# Patient Record
Sex: Female | Born: 1965
Health system: Southern US, Community
[De-identification: ages and names within clinical notes are randomized; demographics above are authoritative.]

## PROBLEM LIST (undated history)

## (undated) DIAGNOSIS — F32A Depression, unspecified: Secondary | ICD-10-CM

## (undated) DIAGNOSIS — R32 Unspecified urinary incontinence: Secondary | ICD-10-CM

## (undated) DIAGNOSIS — R300 Dysuria: Secondary | ICD-10-CM

## (undated) DIAGNOSIS — R569 Unspecified convulsions: Secondary | ICD-10-CM

## (undated) DIAGNOSIS — F419 Anxiety disorder, unspecified: Secondary | ICD-10-CM

## (undated) DIAGNOSIS — F329 Major depressive disorder, single episode, unspecified: Secondary | ICD-10-CM

## (undated) DIAGNOSIS — I639 Cerebral infarction, unspecified: Secondary | ICD-10-CM

## (undated) DIAGNOSIS — N189 Chronic kidney disease, unspecified: Secondary | ICD-10-CM

## (undated) DIAGNOSIS — E785 Hyperlipidemia, unspecified: Secondary | ICD-10-CM

## (undated) DIAGNOSIS — J439 Emphysema, unspecified: Secondary | ICD-10-CM

## (undated) DIAGNOSIS — Z8673 Personal history of transient ischemic attack (TIA), and cerebral infarction without residual deficits: Secondary | ICD-10-CM

## (undated) DIAGNOSIS — N390 Urinary tract infection, site not specified: Secondary | ICD-10-CM

## (undated) DIAGNOSIS — F17200 Nicotine dependence, unspecified, uncomplicated: Secondary | ICD-10-CM

## (undated) HISTORY — DX: Hyperlipidemia, unspecified: E78.5

## (undated) HISTORY — DX: Unspecified urinary incontinence: R32

## (undated) HISTORY — PX: TUBAL LIGATION: SHX77

## (undated) HISTORY — DX: Urinary tract infection, site not specified: N39.0

## (undated) HISTORY — DX: Personal history of transient ischemic attack (TIA), and cerebral infarction without residual deficits: Z86.73

## (undated) HISTORY — DX: Nicotine dependence, unspecified, uncomplicated: F17.200

## (undated) HISTORY — DX: Depression, unspecified: F32.A

## (undated) HISTORY — DX: Unspecified convulsions: R56.9

## (undated) HISTORY — DX: Chronic kidney disease, unspecified: N18.9

## (undated) HISTORY — DX: Dysuria: R30.0

## (undated) HISTORY — DX: Emphysema, unspecified: J43.9

## (undated) HISTORY — DX: Major depressive disorder, single episode, unspecified: F32.9

## (undated) HISTORY — DX: Anxiety disorder, unspecified: F41.9

---

## 2004-11-06 ENCOUNTER — Emergency Department: Payer: Self-pay | Admitting: Emergency Medicine

## 2004-11-06 ENCOUNTER — Other Ambulatory Visit: Payer: Self-pay

## 2006-11-16 DIAGNOSIS — I639 Cerebral infarction, unspecified: Secondary | ICD-10-CM

## 2006-11-16 HISTORY — DX: Cerebral infarction, unspecified: I63.9

## 2006-11-18 ENCOUNTER — Emergency Department: Payer: Self-pay

## 2006-11-18 ENCOUNTER — Other Ambulatory Visit: Payer: Self-pay

## 2007-11-29 ENCOUNTER — Emergency Department: Payer: Self-pay | Admitting: Emergency Medicine

## 2008-06-02 HISTORY — PX: GALLBLADDER SURGERY: SHX652

## 2008-08-22 ENCOUNTER — Emergency Department: Payer: Self-pay | Admitting: Emergency Medicine

## 2009-12-24 ENCOUNTER — Emergency Department: Payer: Self-pay | Admitting: Emergency Medicine

## 2011-08-28 ENCOUNTER — Encounter: Payer: Self-pay | Admitting: Obstetrics and Gynecology

## 2012-05-27 ENCOUNTER — Other Ambulatory Visit: Payer: Self-pay

## 2012-05-27 DIAGNOSIS — Z1231 Encounter for screening mammogram for malignant neoplasm of breast: Secondary | ICD-10-CM

## 2012-06-28 ENCOUNTER — Ambulatory Visit
Admission: RE | Admit: 2012-06-28 | Discharge: 2012-06-28 | Disposition: A | Discharge status: Home / Self Care | Payer: Medicaid Other | Source: Ambulatory Visit

## 2012-06-28 DIAGNOSIS — Z1231 Encounter for screening mammogram for malignant neoplasm of breast: Secondary | ICD-10-CM

## 2015-02-27 DIAGNOSIS — F329 Major depressive disorder, single episode, unspecified: Secondary | ICD-10-CM | POA: Insufficient documentation

## 2015-02-27 DIAGNOSIS — I639 Cerebral infarction, unspecified: Secondary | ICD-10-CM | POA: Insufficient documentation

## 2015-02-27 DIAGNOSIS — Z8673 Personal history of transient ischemic attack (TIA), and cerebral infarction without residual deficits: Secondary | ICD-10-CM | POA: Insufficient documentation

## 2015-02-27 DIAGNOSIS — F32A Depression, unspecified: Secondary | ICD-10-CM | POA: Insufficient documentation

## 2015-03-29 ENCOUNTER — Other Ambulatory Visit: Payer: Self-pay

## 2015-04-03 ENCOUNTER — Telehealth: Payer: Self-pay | Admitting: Family Medicine

## 2015-04-03 NOTE — Telephone Encounter (Signed)
Patient is requesting a refill on xanex. She is completely out. Last seen on 10-06-14

## 2015-04-03 NOTE — Telephone Encounter (Signed)
Needs appointment

## 2015-04-04 ENCOUNTER — Other Ambulatory Visit: Payer: Self-pay

## 2015-04-04 NOTE — Telephone Encounter (Signed)
Patient has appointment scheduled for 04-05-15

## 2015-04-05 ENCOUNTER — Ambulatory Visit (INDEPENDENT_AMBULATORY_CARE_PROVIDER_SITE_OTHER): Payer: Medicare HMO | Admitting: Family Medicine

## 2015-04-05 ENCOUNTER — Encounter: Payer: Self-pay | Admitting: Family Medicine

## 2015-04-05 VITALS — BP 102/64 | HR 110 | Temp 99.0°F | Resp 12 | Ht 66.0 in | Wt 144.3 lb

## 2015-04-05 DIAGNOSIS — G40909 Epilepsy, unspecified, not intractable, without status epilepticus: Secondary | ICD-10-CM

## 2015-04-05 DIAGNOSIS — Z8673 Personal history of transient ischemic attack (TIA), and cerebral infarction without residual deficits: Secondary | ICD-10-CM

## 2015-04-05 DIAGNOSIS — F418 Other specified anxiety disorders: Secondary | ICD-10-CM

## 2015-04-05 DIAGNOSIS — F3341 Major depressive disorder, recurrent, in partial remission: Secondary | ICD-10-CM | POA: Diagnosis not present

## 2015-04-05 DIAGNOSIS — F17200 Nicotine dependence, unspecified, uncomplicated: Secondary | ICD-10-CM | POA: Diagnosis not present

## 2015-04-05 DIAGNOSIS — E785 Hyperlipidemia, unspecified: Secondary | ICD-10-CM | POA: Diagnosis not present

## 2015-04-05 MED ORDER — ALPRAZOLAM 0.5 MG PO TABS: 0.5000 mg | ORAL_TABLET | Freq: Three times a day (TID) | ORAL | Status: DC | PRN

## 2015-04-05 NOTE — Progress Notes (Signed)
Name: Kim Gallagher   MRN: [id number]    DOB: 09/13/1965   Date:04/05/2015       Progress Note  Subjective  Chief Complaint  Chief Complaint  Patient presents with  . Medication Refill    xanax    HPI  Kim Gallagher is a 49 year old female here for follow up, last seen 10/06/14.  Requesting Xanax refill. I have not received notes or blood work from her Neurologist to date. She reports being seizure free since mid 2015. Still taking Dilantin 300 mg at bedtime, Simvastatin 40 mg at bedtime, Zoloft 150 mg a day, ASA 325 mg a day. Still smoking cigarettes up to 1 ppd since age 89 yo. Not ready to quit.   Anxiety: Patient complains of anxiety disorder, panic attacks, post traumatic stress  disorder, sleep disturbance and depression.  She has the following symptoms: difficulty concentrating, fatigue, insomnia, irritable, racing thoughts. Onset of symptoms was approximately several years ago, controlled since that time. She denies current suicidal and homicidal ideation. Family history significant for anxiety and depression. Possible organic causes contributing are: neuro. Risk factors: negative life event TIA with subsequent Seizures and previous episode of depression Previous treatment includes Xanax and Zoloft and none.  She complains of the following side effects from the treatment: none.    Past Medical History  Diagnosis Date  . Hyperlipidemia, acquired   . Heavy smoker   . Chronic major depressive disorder (HCC)   . Anxiety   . Seizures (HCC)   . History of TIA (transient ischemic attack)   . Dysuria   . Urinary incontinence in female     Patient Active Problem List   Diagnosis Date Noted  . Hyperlipidemia LDL goal <100 04/05/2015  . Heavy tobacco smoker 04/05/2015  . Major depressive disorder, recurrent episode, in partial remission with anxious distress (HCC) 04/05/2015  . Seizure disorder (HCC) 04/05/2015  . History of TIA (transient ischemic attack) 04/05/2015    Social History   Substance Use Topics  . Smoking status: Current Every Day Smoker    Types: Cigarettes  . Smokeless tobacco: Not on file  . Alcohol Use: No     Current outpatient prescriptions:  .  ALPRAZolam (XANAX) 0.5 MG tablet, Take 0.5 mg by mouth every 8 (eight) hours as needed., Disp: , Rfl:  .  aspirin 325 MG tablet, Take 325 mg by mouth daily., Disp: , Rfl:  .  phenytoin (DILANTIN) 300 MG ER capsule, Take 300 mg by mouth 3 times daily with meals, bedtime and 2 AM., Disp: , Rfl:  .  sertraline (ZOLOFT) 100 MG tablet, Take 150 mg by mouth daily., Disp: , Rfl:  .  simvastatin (ZOCOR) 40 MG tablet, Take 40 mg by mouth daily., Disp: , Rfl:   Past Surgical History  Procedure Laterality Date  . Tubal ligation      Family History  Problem Relation Age of Onset  . Kidney disease Mother   . Hypertension Mother   . Depression Mother   . Diabetes Mother   . Stroke Father   . Hypertension Father   . Cancer Father     thyroid  . Diabetes Father   . Diabetes Sister   . Diabetes Brother     Allergies  Allergen Reactions  . Sulfa Antibiotics Rash     Review of Systems  CONSTITUTIONAL: No significant weight changes, fever, chills, weakness or fatigue.  CARDIOVASCULAR: No chest pain, chest pressure or chest discomfort. No palpitations or edema.  RESPIRATORY: No shortness of breath, cough or sputum.  NEUROLOGICAL: No headache, dizziness, syncope, paralysis, ataxia, numbness or tingling in the extremities. No memory changes. No change in bowel or bladder control.  MUSCULOSKELETAL: No joint pain. No muscle pain. HEMATOLOGIC: No anemia, bleeding or bruising.  LYMPHATICS: No enlarged lymph nodes.  PSYCHIATRIC: No change in mood. No change in sleep pattern.  ENDOCRINOLOGIC: No reports of sweating, cold or heat intolerance. No polyuria or polydipsia.     Objective  BP 102/64 mmHg  Pulse 110  Temp(Src) 99 F (37.2 C) (Oral)  Resp 12  Ht  (1.676 m)  Wt 144 lb 4.8 oz (65.454 kg)   BMI 23.30 kg/m2  SpO2 95% Body mass index is 23.3 kg/(m^2).  Physical Exam  Constitutional: Patient appears well-developed and well-nourished. In no distress. Skin is bronzed and tan with leathery texture. She smells of cigarette smoke.   Cardiovascular: Normal rate, regular rhythm and normal heart sounds.  No murmur heard.  Pulmonary/Chest: Effort normal and breath sounds normal. No respiratory distress. Peripheral vascular: Bilateral LE no edema. Neurological: CN II-XII grossly intact with no focal deficits. Alert and oriented to person, place, and time. Coordination, balance, strength, speech and gait are normal.  Skin: Skin is warm and dry. No rash noted. No erythema.  Psychiatric: Patient has a stable agitate mood and affect. Behavior is normal in office today, stable restlessness while sitting in chair. Judgment and thought content normal in office today.  Assessment & Plan  1. Hyperlipidemia LDL goal <100 Recheck FLP  - CBC with Differential/Platelet - Comprehensive metabolic panel - Lipid panel - TSH - Dilantin (Phenytoin) level, total  2. Heavy tobacco smoker The patient has been counseled on smoking cessation benefits, goals, strategies and available over the counter and prescription medications that may help them in their efforts.  Options discussed include Nicoderm patches, Wellbutrin and Chantix.  The patient voices understanding their increased risk of cardiovascular and pulmonary diseases with continued use of tobacco products.  - CBC with Differential/Platelet - Comprehensive metabolic panel - Lipid panel - TSH - Dilantin (Phenytoin) level, total  3. Major depressive disorder, recurrent episode, in partial remission with anxious distress (HCC) Stable. Expressed my concern that she may have dependency to alprazolam.   - CBC with Differential/Platelet - Comprehensive metabolic panel - Lipid panel - TSH - Dilantin (Phenytoin) level, total - ALPRAZolam (XANAX)  0.5 MG tablet; Take 1 tablet (0.5 mg total) by mouth every 8 (eight) hours as needed.  Dispense: 90 tablet; Refill: 2  4. Seizure disorder (HCC) Stable.  - CBC with Differential/Platelet - Comprehensive metabolic panel - Lipid panel - TSH - Dilantin (Phenytoin) level, total  5. History of TIA (transient ischemic attack) Still has risks for repeat TIA or CVA due to heavy smoking.  - CBC with Differential/Platelet - Comprehensive metabolic panel - Lipid panel - TSH - Dilantin (Phenytoin) level, total

## 2015-06-21 ENCOUNTER — Other Ambulatory Visit: Payer: Self-pay

## 2015-06-21 DIAGNOSIS — F418 Other specified anxiety disorders: Principal | ICD-10-CM

## 2015-06-21 DIAGNOSIS — F3341 Major depressive disorder, recurrent, in partial remission: Secondary | ICD-10-CM

## 2015-06-22 MED ORDER — ALPRAZOLAM 0.5 MG PO TABS: 0.5000 mg | ORAL_TABLET | Freq: Three times a day (TID) | ORAL | Status: DC | PRN

## 2015-06-26 ENCOUNTER — Other Ambulatory Visit: Payer: Self-pay

## 2015-06-26 MED ORDER — SERTRALINE HCL 100 MG PO TABS: 150.0000 mg | ORAL_TABLET | Freq: Every day | ORAL | Status: DC

## 2015-08-27 ENCOUNTER — Other Ambulatory Visit: Payer: Self-pay

## 2015-08-27 DIAGNOSIS — F418 Other specified anxiety disorders: Principal | ICD-10-CM

## 2015-08-27 DIAGNOSIS — F3341 Major depressive disorder, recurrent, in partial remission: Secondary | ICD-10-CM
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
## 2015-08-28 ENCOUNTER — Telehealth: Payer: Self-pay

## 2015-08-28 NOTE — Telephone Encounter (Signed)
Called patient back home # has been disconnected and tried cell phone which stated a different persons name but left voicemaill will need appointment in order to get it filled.

## 2015-08-29 ENCOUNTER — Telehealth: Payer: Self-pay | Admitting: Family Medicine

## 2015-08-29 NOTE — Telephone Encounter (Signed)
Patient needs an appointment, requesting refill too early

## 2015-08-30 NOTE — Telephone Encounter (Signed)
Open in error

## 2015-09-19 NOTE — Telephone Encounter (Signed)
I do not see any action on this Patient has not scheduled a f/u appt; will close out note

## 2015-10-05 ENCOUNTER — Emergency Department
Admission: EM | Admit: 2015-10-05 | Discharge: 2015-10-05 | Disposition: A | Discharge status: Home / Self Care | Payer: Medicare HMO | Attending: Emergency Medicine | Admitting: Emergency Medicine

## 2015-10-05 DIAGNOSIS — Z79899 Other long term (current) drug therapy: Secondary | ICD-10-CM | POA: Insufficient documentation

## 2015-10-05 DIAGNOSIS — Z8673 Personal history of transient ischemic attack (TIA), and cerebral infarction without residual deficits: Secondary | ICD-10-CM | POA: Diagnosis not present

## 2015-10-05 DIAGNOSIS — I69398 Other sequelae of cerebral infarction: Secondary | ICD-10-CM | POA: Diagnosis not present

## 2015-10-05 DIAGNOSIS — IMO0002 Reserved for concepts with insufficient information to code with codable children: Secondary | ICD-10-CM

## 2015-10-05 DIAGNOSIS — F329 Major depressive disorder, single episode, unspecified: Secondary | ICD-10-CM | POA: Insufficient documentation

## 2015-10-05 DIAGNOSIS — E785 Hyperlipidemia, unspecified: Secondary | ICD-10-CM | POA: Diagnosis not present

## 2015-10-05 DIAGNOSIS — F17210 Nicotine dependence, cigarettes, uncomplicated: Secondary | ICD-10-CM | POA: Insufficient documentation

## 2015-10-05 DIAGNOSIS — Z591 Inadequate housing: Secondary | ICD-10-CM | POA: Diagnosis not present

## 2015-10-05 DIAGNOSIS — Z7982 Long term (current) use of aspirin: Secondary | ICD-10-CM | POA: Insufficient documentation

## 2015-10-05 DIAGNOSIS — Z046 Encounter for general psychiatric examination, requested by authority: Secondary | ICD-10-CM | POA: Diagnosis present

## 2015-10-05 DIAGNOSIS — F070 Personality change due to known physiological condition: Secondary | ICD-10-CM

## 2015-10-05 HISTORY — DX: Reserved for concepts with insufficient information to code with codable children: IMO0002

## 2015-10-05 LAB — COMPREHENSIVE METABOLIC PANEL
ALBUMIN: 4 g/dL (ref 3.5–5.0)
ALK PHOS: 151 U/L — AB (ref 38–126)
ALT: 12 U/L — ABNORMAL LOW (ref 14–54)
ANION GAP: 9 (ref 5–15)
AST: 16 U/L (ref 15–41)
BUN: 7 mg/dL (ref 6–20)
CALCIUM: 9 mg/dL (ref 8.9–10.3)
CO2: 26 mmol/L (ref 22–32)
Chloride: 105 mmol/L (ref 101–111)
Creatinine, Ser: 0.65 mg/dL (ref 0.44–1.00)
GFR calc non Af Amer: >60 mL/min (ref >60)
Glucose, Bld: 93 mg/dL (ref 65–99)
POTASSIUM: 3.3 mmol/L — AB (ref 3.5–5.1)
SODIUM: 140 mmol/L (ref 135–145)
TOTAL PROTEIN: 6.9 g/dL (ref 6.5–8.1)
Total Bilirubin: 0.4 mg/dL (ref 0.3–1.2)

## 2015-10-05 LAB — CBC
HCT: 44.9 % (ref 35.0–47.0)
Hemoglobin: 15.2 g/dL (ref 12.0–16.0)
MCH: 31.9 pg (ref 26.0–34.0)
MCHC: 33.8 g/dL (ref 32.0–36.0)
MCV: 94.6 fL (ref 80.0–100.0)
PLATELETS: 227 10*3/uL (ref 150–440)
RBC: 4.75 MIL/uL (ref 3.80–5.20)
RDW: 14.6 % — ABNORMAL HIGH (ref 11.5–14.5)
WBC: 7.6 10*3/uL (ref 3.6–11.0)

## 2015-10-05 LAB — ETHANOL: Alcohol, Ethyl (B): <5 mg/dL (ref <5)

## 2015-10-05 NOTE — ED Notes (Addendum)
Pt IVC'd by son.  IVC paperwork sts that pt normally lives w/ sister, but sister has been recently hospitalized and that pt is not taking care of herself.  Pt appears disheveled, slight body odor noted.  Pt fidgeting in triage, unable to stay still.  Denies SI/HI

## 2015-10-05 NOTE — Consult Note (Signed)
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 Personality change due to stroke [I69.398, F07.0] 10/05/2015  . Hyperlipidemia LDL goal <100 [E78.5] 04/05/2015  . Heavy tobacco smoker [F17.200] 04/05/2015  . Major depressive disorder, recurrent episode, in partial remission with anxious distress ([place]) [F33.41] 04/05/2015  . Seizure disorder ([place]) [H06.237] 04/05/2015  . History of TIA (transient ischemic attack) [Z86.73] 04/05/2015    Total Time spent with patient: 1 hour  Subjective:   Kim Gallagher is a 50 y.o. female patient admitted with "my son is mad at me".  HPI:  Patient interviewed. Chart reviewed including reviewing some neurology notes. Case discussed with ER doctor and TTS. Labs reviewed. 50 year old woman brought here on commitment papers filed by her son. Commitment papers allege that she is doing a poor job taking care of herself. The patient says that her son has been nagging her for weeks about what he considers her poor hygiene. She admits that her apartment is not very well kept. She admits that there are dirty plates on the floor and some dirty clothes on the floor. She denies that there are animal feces on the floor saying that all the animal feces or in the litter box. Patient denies any acute mood symptoms. She denies being depressed. She says she sleeps okay. Denies any suicidal or homicidal thoughts at all. Denies feeling paranoid or confused. Denies having any hallucinations. She says she has been compliant with her prescribed medication. She does not report any new physical complaints. Patient says that she has been keeping up with  making food for herself at home. She says she has been keeping up with her medicine. Patient had a stroke in 2008. She admits that her thinking and personality changed after that. She describes herself as being "lazy" now and saying that she just doesn't care very much about the mess around her apartment but she denies that there is a dangerous level of filth in the house.  Social history: Patient lives with her sister in an apartment. The sister is currently in the hospital at [facility] for what sounds like complications of diabetes. Patient is aware of this and says that the sister may need an amputation and so it may be a couple more weeks or longer before the sister comes home. Prior to that the patient had been living independently. She says she has friends that she stays in touch with. She is able to get taxis and rides to take her to the store when she needs to.  Medical history: Patient has a history of dyslipidemia and a stroke in 2008. Also apparently had seizures in 2014 and in the past had been on Dilantin. She did not mention that to me now.  Substance abuse history: Denies that she drinks alcohol at all and denies any drug abuse. In the past was mention that she uses marijuana she denied that now.  Past Psychiatric History: Patient says she had one psychiatric hospitalization in the past at [facility]. This was many years ago. She says at that time she was having suicidal thoughts but did not actually try to kill her self. He is currently taking Zoloft and Xanax regularly. She says  she has kept up with her medicine and has not had a return of serious depression. Denies any of her trying to kill himself denies any psychotic symptoms.  Risk to Self: Is patient at risk for suicide?: No Risk to Others:   Prior Inpatient Therapy:   Prior Outpatient Therapy:    Past Medical History:  Past Medical History  Diagnosis Date  . Hyperlipidemia, acquired   . Heavy smoker   . Chronic major depressive  disorder ([place])   . Anxiety   . Seizures ([place])   . History of TIA (transient ischemic attack)   . Dysuria   . Urinary incontinence in female     Past Surgical History  Procedure Laterality Date  . Tubal ligation     Family History:  Family History  Problem Relation Age of Onset  . Kidney disease Mother   . Hypertension Mother   . Depression Mother   . Diabetes Mother   . Stroke Father   . Hypertension Father   . Cancer Father     thyroid  . Diabetes Father   . Diabetes Sister   . Diabetes Brother    Family Psychiatric  History: Patient is not aware of any family history of mental illness Social History:  History  Alcohol Use No     History  Drug Use No    Social History   Social History  . Marital Status: Single    Spouse Name: N/A  . Number of Children: N/A  . Years of Education: N/A   Social History Main Topics  . Smoking status: Current Every Day Smoker -- 1.00 packs/day    Types: Cigarettes  . Smokeless tobacco: None  . Alcohol Use: No  . Drug Use: No  . Sexual Activity: No   Other Topics Concern  . None   Social History Narrative   Additional Social History:    Allergies:   Allergies  Allergen Reactions  . Sulfa Antibiotics Rash    Labs:  Results for orders placed or performed during the hospital encounter of 10/05/15 (from the past 48 hour(s))  Comprehensive metabolic panel     Status: Abnormal   Collection Time: 10/05/15  2:49 PM  Result Value Ref Range   Sodium 140 135 - 145 mmol/L   Potassium 3.3 (L) 3.5 - 5.1 mmol/L   Chloride 105 101 - 111 mmol/L   CO2 26 22 - 32 mmol/L   Glucose, Bld 93 65 - 99 mg/dL   BUN 7 6 - 20 mg/dL   Creatinine, Ser 0.65 0.44 - 1.00 mg/dL   Calcium 9.0 8.9 - 10.3 mg/dL   Total Protein 6.9 6.5 - 8.1 g/dL   Albumin 4.0 3.5 - 5.0 g/dL   AST 16 15 - 41 U/L   ALT 12 (L) 14 - 54 U/L   Alkaline Phosphatase 151 (H) 38 - 126 U/L   Total Bilirubin 0.4 0.3 - 1.2 mg/dL   GFR calc non Af Amer >60 >60 mL/min   GFR  calc Af Amer >60 >60 mL/min    Comment: (NOTE) The eGFR has been calculated using the CKD EPI equation. This calculation has not been validated in all clinical situations. eGFR's persistently <60 mL/min signify possible Chronic Kidney Disease.    Anion gap 9 5 - 15  Ethanol     Status: None   Collection Time: 10/05/15  2:49 PM  Result Value Ref Range   Alcohol, Ethyl (B) <5 <5 mg/dL    Comment:  LOWEST DETECTABLE LIMIT FOR SERUM ALCOHOL IS 5 mg/dL FOR MEDICAL PURPOSES ONLY   cbc     Status: Abnormal   Collection Time: 10/05/15  2:49 PM  Result Value Ref Range   WBC 7.6 3.6 - 11.0 K/uL   RBC 4.75 3.80 - 5.20 MIL/uL   Hemoglobin 15.2 12.0 - 16.0 g/dL   HCT 44.9 35.0 - 47.0 %   MCV 94.6 80.0 - 100.0 fL   MCH 31.9 26.0 - 34.0 pg   MCHC 33.8 32.0 - 36.0 g/dL   RDW 14.6 (H) 11.5 - 14.5 %   Platelets 227 150 - 440 K/uL    No current facility-administered medications for this encounter.   Current Outpatient Prescriptions  Medication Sig Dispense Refill  . ALPRAZolam (XANAX) 0.5 MG tablet Take 1 tablet (0.5 mg total) by mouth every 8 (eight) hours as needed. 90 tablet 2  . aspirin 325 MG tablet Take 325 mg by mouth daily.    . phenytoin (DILANTIN) 100 MG ER capsule Take 300 mg by mouth at bedtime.    . sertraline (ZOLOFT) 100 MG tablet Take 1.5 tablets (150 mg total) by mouth daily. 45 tablet 3  . simvastatin (ZOCOR) 40 MG tablet Take 40 mg by mouth daily.      Musculoskeletal: Strength & Muscle Tone: within normal limits Gait & Station: normal Patient leans: N/A  Psychiatric Specialty Exam: Review of Systems  Constitutional: Negative.   HENT: Negative.   Eyes: Negative.   Respiratory: Negative.   Cardiovascular: Negative.   Gastrointestinal: Negative.   Musculoskeletal: Negative.   Skin: Negative.   Neurological: Negative.   Psychiatric/Behavioral: Negative for depression, suicidal ideas, hallucinations, memory loss and substance abuse. The patient is not  nervous/anxious and does not have insomnia.     Blood pressure 115/60, pulse 115, temperature 98.6 F (37 C), temperature source Oral, resp. rate 18, height '5\' 6"'  (1.676 m), weight 54.432 kg (120 lb), SpO2 97 %.Body mass index is 19.38 kg/(m^2).  General Appearance: Casual  Eye Contact::  Good  Speech:  Clear and Coherent  Volume:  Decreased  Mood:  Euthymic  Affect:  Constricted  Thought Process:  Coherent and Goal Directed  Orientation:  Full (Time, Place, and Person)  Thought Content:  Negative  Suicidal Thoughts:  No  Homicidal Thoughts:  No  Memory:  Immediate;   Good Recent;   Fair Remote;   Fair  Judgement:  Fair  Insight:  Fair  Psychomotor Activity:  Normal  Concentration:  Fair  Recall:  [employer] of Knowledge:Fair  Language: Fair  Akathisia:  No  Handed:  Right  AIMS (if indicated):     Assets:  Agricultural consultant Resilience Social Support  ADL's:  Intact  Cognition: WNL  Sleep:      Treatment Plan Summary: Plan 50 year old woman who was petitioned by her son. To my examination I did not find any evidence of acute mental illness. Did not find any evidence of psychosis or mood symptoms. Patient does appear to have some cognitive impairment but it's relatively mild. She could remember 2 out of 3 words at 3 minutes. She could spell a word backwards correctly. She was able to write a correct sentence properly. She was not able to copy the figure correctly. She was fully alert and oriented however and was able to describe her daily routine new her medicines and knew what was going on with her sister. There is no evidence that I can see that she  has an acute mental illness. I suspect that just as she is describing she had some change in her personality and behavior subsequent to the stroke. At this point does not meet commitment criteria and doesn't need psychiatric hospitalization. Case reviewed with emergency room doctor. I will check with  the patient about whether she is supposed to still be taking Dilantin. I think she can be discharged back home and continue follow-up with her usual outpatient medicine providers.  Disposition: Patient does not meet criteria for psychiatric inpatient admission. Supportive therapy provided about ongoing stressors.  Alethia Berthold, MD 10/05/2015 4:44 PM

## 2015-10-05 NOTE — ED Notes (Signed)
Pt reports living with her sister and for past month feels depressed.  Pt not doing anything.  States house is a mess, not showering.  Hair is a mess.  Denies SI or HI.  Denies drug use or etoh.  Denies hearing voices.  Pt is IVC.  Pt calm and cooperative.  Pt in hallway bed.  Unable to void at this time.

## 2015-10-05 NOTE — ED Provider Notes (Signed)
[facility] Emergency Department Provider Note  Time seen: 3:31 PM  I have reviewed the triage vital signs and the nursing notes.   HISTORY  Chief Complaint Psychiatric Evaluation    HPI Kim Gallagher is a 50 y.o. female with a past medical history of hyperlipidemia, anxiety, seizure disorder, presents to the emergency department under an involuntary commitment for unsafe living conditions. According to the involuntary commitment and patient is currently living with her sister however due to a recent medical issue the sister is now hospitalized and the patient is essentially living alone. He went to check on her today and found her to be very disheveled, not bathing, house was very unclean with pet feces and urine within the house, he called the sheriff and have the patient IVC. He has the patient admits to having a messy house but states it is because she is lazy.The patient denies any medical complaints.     Past Medical History  Diagnosis Date  . Hyperlipidemia, acquired   . Heavy smoker   . Chronic major depressive disorder (HCC)   . Anxiety   . Seizures (HCC)   . History of TIA (transient ischemic attack)   . Dysuria   . Urinary incontinence in female     Patient Active Problem List   Diagnosis Date Noted  . Hyperlipidemia LDL goal <100 04/05/2015  . Heavy tobacco smoker 04/05/2015  . Major depressive disorder, recurrent episode, in partial remission with anxious distress (HCC) 04/05/2015  . Seizure disorder (HCC) 04/05/2015  . History of TIA (transient ischemic attack) 04/05/2015    Past Surgical History  Procedure Laterality Date  . Tubal ligation      Current Outpatient Rx  Name  Route  Sig  Dispense  Refill  . ALPRAZolam (XANAX) 0.5 MG tablet   Oral   Take 1 tablet (0.5 mg total) by mouth every 8 (eight) hours as needed.   90 tablet   2   . aspirin 325 MG tablet   Oral   Take 325 mg by mouth daily.         . phenytoin  (DILANTIN) 100 MG ER capsule   Oral   Take 300 mg by mouth at bedtime.         . sertraline (ZOLOFT) 100 MG tablet   Oral   Take 1.5 tablets (150 mg total) by mouth daily.   45 tablet   3   . simvastatin (ZOCOR) 40 MG tablet   Oral   Take 40 mg by mouth daily.           Allergies Sulfa antibiotics  Family History  Problem Relation Age of Onset  . Kidney disease Mother   . Hypertension Mother   . Depression Mother   . Diabetes Mother   . Stroke Father   . Hypertension Father   . Cancer Father     thyroid  . Diabetes Father   . Diabetes Sister   . Diabetes Brother     Social History Social History  Substance Use Topics  . Smoking status: Current Every Day Smoker -- 1.00 packs/day    Types: Cigarettes  . Smokeless tobacco: None  . Alcohol Use: No    Review of Systems Constitutional: Negative for fever. Cardiovascular: Negative for chest pain. Respiratory: Negative for shortness of breath. Gastrointestinal: Negative for abdominal pain Musculoskeletal: Negative for back pain. Neurological: Negative for headaches, focal weakness or numbness. Psychiatric:Denies SI or HI. Admits to having unclean living conditions  but states is because she is lazy. 10-point ROS otherwise negative.  ____________________________________________   PHYSICAL EXAM:  VITAL SIGNS: ED Triage Vitals  Enc Vitals Group     BP 10/05/15 1443 115/60 mmHg     Pulse Rate 10/05/15 1443 115     Resp 10/05/15 1443 18     Temp 10/05/15 1443 98.6 F (37 C)     Temp Source 10/05/15 1443 Oral     SpO2 10/05/15 1443 97 %     Weight 10/05/15 1443 120 lb (54.432 kg)     Height 10/05/15 1443  (1.676 m)     Head Cir --      Peak Flow --      Pain Score 10/05/15 1453 0     Pain Loc --      Pain Edu? --      Excl. in GC? --     Constitutional: Alert and oriented. Well appearing and in no distress.Mildly disheveled appearing. Eyes: Normal exam ENT   Head: Normocephalic and  atraumatic.   Mouth/Throat: Mucous membranes are moist. Cardiovascular: Normal rate, regular rhythm. No murmurs, rubs, or gallops. Respiratory: Normal respiratory effort without tachypnea nor retractions. Breath sounds are clear  Gastrointestinal: Soft and nontender. No distention.   Musculoskeletal: Nontender with normal range of motion in all extremities.  Neurologic:  Normal speech and language. No gross focal neurologic deficits Skin:  Skin is warm, dry and intact.  Psychiatric: Mood and affect are normal. Calm and cooperative.  ____________________________________________    INITIAL IMPRESSION / ASSESSMENT AND PLAN / ED COURSE  Pertinent labs & imaging results that were available during my care of the patient were reviewed by me and considered in my medical decision making (see chart for details).  Patient presents the emergency department, overall well-appearing with no medical complaints. IVC states unsafe living conditions. Patient admits to the unsafe conditions but states it is just because she is lazy. We'll keep the patient under an involuntary commitment until psychiatry can adequately evaluate.  Labs are largely within normal limits. Patient has been seen by psychiatry, they believe the patient is safe for discharge home. Patient does appear calm, cooperative, denies any complaints at this time. We'll discharge home.  ____________________________________________   FINAL CLINICAL IMPRESSION(S) / ED DIAGNOSES  Unsafe living conditions   Minna Antis, MD 10/05/15 1720

## 2015-10-08 ENCOUNTER — Telehealth: Payer: Self-pay | Admitting: Family Medicine

## 2015-10-08 MED ORDER — SERTRALINE HCL 100 MG PO TABS: 150.0000 mg | ORAL_TABLET | Freq: Every day | ORAL | Status: DC

## 2015-10-08 NOTE — Telephone Encounter (Signed)
Please refill Sertraline.

## 2015-10-08 NOTE — Telephone Encounter (Signed)
I'll be glad to refill this Please ask patient to make an appointment in the next few weeks We see that she was in the ER, and we have not seen her in over 6 months; just want to make sure all is well, and follow-up on her cholesterol, get fasting labs, recheck a liver enzyme; also her potassium in the ER was low; we have lots to follow-up on I look forward to meeting her

## 2015-10-08 NOTE — Telephone Encounter (Signed)
LVM to inform pt to call the office and schedule an appt

## 2015-10-22 ENCOUNTER — Other Ambulatory Visit: Payer: Self-pay | Admitting: Family Medicine

## 2015-10-23 NOTE — Telephone Encounter (Signed)
appt needed for further refills; note to pharmacy; see last phone note; request made for pt to schedule appt

## 2015-11-02 ENCOUNTER — Ambulatory Visit (INDEPENDENT_AMBULATORY_CARE_PROVIDER_SITE_OTHER): Payer: Medicare HMO | Admitting: Family Medicine

## 2015-11-02 ENCOUNTER — Encounter: Payer: Self-pay | Admitting: Family Medicine

## 2015-11-02 VITALS — BP 134/68 | HR 93 | Temp 98.4°F | Resp 18 | Ht 66.0 in | Wt 129.9 lb

## 2015-11-02 DIAGNOSIS — E785 Hyperlipidemia, unspecified: Secondary | ICD-10-CM

## 2015-11-02 DIAGNOSIS — Z1159 Encounter for screening for other viral diseases: Secondary | ICD-10-CM | POA: Diagnosis not present

## 2015-11-02 DIAGNOSIS — Z0289 Encounter for other administrative examinations: Secondary | ICD-10-CM | POA: Diagnosis not present

## 2015-11-02 DIAGNOSIS — R748 Abnormal levels of other serum enzymes: Secondary | ICD-10-CM | POA: Diagnosis not present

## 2015-11-02 DIAGNOSIS — R5383 Other fatigue: Secondary | ICD-10-CM | POA: Diagnosis not present

## 2015-11-02 DIAGNOSIS — F17200 Nicotine dependence, unspecified, uncomplicated: Secondary | ICD-10-CM

## 2015-11-02 DIAGNOSIS — Z79899 Other long term (current) drug therapy: Secondary | ICD-10-CM | POA: Diagnosis not present

## 2015-11-02 DIAGNOSIS — F418 Other specified anxiety disorders: Principal | ICD-10-CM

## 2015-11-02 DIAGNOSIS — Z114 Encounter for screening for human immunodeficiency virus [HIV]: Secondary | ICD-10-CM | POA: Diagnosis not present

## 2015-11-02 DIAGNOSIS — G40909 Epilepsy, unspecified, not intractable, without status epilepticus: Secondary | ICD-10-CM

## 2015-11-02 DIAGNOSIS — F3341 Major depressive disorder, recurrent, in partial remission: Secondary | ICD-10-CM

## 2015-11-02 MED ORDER — ALPRAZOLAM 0.5 MG PO TABS: 0.5000 mg | ORAL_TABLET | Freq: Three times a day (TID) | ORAL | Status: DC | PRN

## 2015-11-02 MED ORDER — SERTRALINE HCL 100 MG PO TABS: 200.0000 mg | ORAL_TABLET | Freq: Every day | ORAL | Status: DC

## 2015-11-02 NOTE — Assessment & Plan Note (Signed)
Check lipids 

## 2015-11-02 NOTE — Patient Instructions (Addendum)
I do encourage you to quit smoking Call [phone] to sign up for smoking cessation classes You can call 1-800-QUIT-NOW to talk with a smoking cessation coach   Smoking Cessation, Tips for Success If you are ready to quit smoking, congratulations! You have chosen to help yourself be healthier. Cigarettes bring nicotine, tar, carbon monoxide, and other irritants into your body. Your lungs, heart, and blood vessels will be able to work better without these poisons. There are many different ways to quit smoking. Nicotine gum, nicotine patches, a nicotine inhaler, or nicotine nasal spray can help with physical craving. Hypnosis, support groups, and medicines help break the habit of smoking. WHAT THINGS CAN I DO TO MAKE QUITTING EASIER?  Here are some tips to help you quit for good:  Pick a date when you will quit smoking completely. Tell all of your friends and family about your plan to quit on that date.  Do not try to slowly cut down on the number of cigarettes you are smoking. Pick a quit date and quit smoking completely starting on that day.  Throw away all cigarettes.   Clean and remove all ashtrays from your home, work, and car.  On a card, write down your reasons for quitting. Carry the card with you and read it when you get the urge to smoke.  Cleanse your body of nicotine. Drink enough water and fluids to keep your urine clear or pale yellow. Do this after quitting to flush the nicotine from your body.  Learn to predict your moods. Do not let a bad situation be your excuse to have a cigarette. Some situations in your life might tempt you into wanting a cigarette.  Never have "just one" cigarette. It leads to wanting another and another. Remind yourself of your decision to quit.  Change habits associated with smoking. If you smoked while driving or when feeling stressed, try other activities to replace smoking. Stand up when drinking your coffee. Brush your teeth after eating. Sit in  a different chair when you read the paper. Avoid alcohol while trying to quit, and try to drink fewer caffeinated beverages. Alcohol and caffeine may urge you to smoke.  Avoid foods and drinks that can trigger a desire to smoke, such as sugary or spicy foods and alcohol.  Ask people who smoke not to smoke around you.  Have something planned to do right after eating or having a cup of coffee. For example, plan to take a walk or exercise.  Try a relaxation exercise to calm you down and decrease your stress. Remember, you may be tense and nervous for the first 2 weeks after you quit, but this will pass.  Find new activities to keep your hands busy. Play with a pen, coin, or rubber band. Doodle or draw things on paper.  Brush your teeth right after eating. This will help cut down on the craving for the taste of tobacco after meals. You can also try mouthwash.   Use oral substitutes in place of cigarettes. Try using lemon drops, carrots, cinnamon sticks, or chewing gum. Keep them handy so they are available when you have the urge to smoke.  When you have the urge to smoke, try deep breathing.  Designate your home as a nonsmoking area.  If you are a heavy smoker, ask your health care provider about a prescription for nicotine chewing gum. It can ease your withdrawal from nicotine.  Reward yourself. Set aside the cigarette money you save and buy yourself  something nice.  Look for support from others. Join a support group or smoking cessation program. Ask someone at home or at work to help you with your plan to quit smoking.  Always ask yourself, "Do I need this cigarette or is this just a reflex?" Tell yourself, "Today, I choose not to smoke," or "I do not want to smoke." You are reminding yourself of your decision to quit.  Do not replace cigarette smoking with electronic cigarettes (commonly called e-cigarettes). The safety of e-cigarettes is unknown, and some may contain harmful  chemicals.  If you relapse, do not give up! Plan ahead and think about what you will do the next time you get the urge to smoke. HOW WILL I FEEL WHEN I QUIT SMOKING? You may have symptoms of withdrawal because your body is used to nicotine (the addictive substance in cigarettes). You may crave cigarettes, be irritable, feel very hungry, cough often, get headaches, or have difficulty concentrating. The withdrawal symptoms are only temporary. They are strongest when you first quit but will go away within 10-14 days. When withdrawal symptoms occur, stay in control. Think about your reasons for quitting. Remind yourself that these are signs that your body is healing and getting used to being without cigarettes. Remember that withdrawal symptoms are easier to treat than the major diseases that smoking can cause.  Even after the withdrawal is over, expect periodic urges to smoke. However, these cravings are generally short lived and will go away whether you smoke or not. Do not smoke! WHAT RESOURCES ARE AVAILABLE TO HELP ME QUIT SMOKING? Your health care provider can direct you to community resources or hospitals for support, which may include:  Group support.  Education.  Hypnosis.  Therapy.   This information is not intended to replace advice given to you by your health care provider. Make sure you discuss any questions you have with your health care provider.   Document Released: 02/15/2004 Document Revised: 06/09/2014 Document Reviewed: 11/04/2012 Elsevier Interactive Patient Education 2016 [employer]. Steps to Quit Smoking  Smoking tobacco can be harmful to your health and can affect almost every organ in your body. Smoking puts you, and those around you, at risk for developing many serious chronic diseases. Quitting smoking is difficult, but it is one of the best things that you can do for your health. It is never too late to quit. WHAT ARE THE BENEFITS OF QUITTING SMOKING? When you quit  smoking, you lower your risk of developing serious diseases and conditions, such as:  Lung cancer or lung disease, such as COPD.  Heart disease.  Stroke.  Heart attack.  Infertility.  Osteoporosis and bone fractures. Additionally, symptoms such as coughing, wheezing, and shortness of breath may get better when you quit. You may also find that you get sick less often because your body is stronger at fighting off colds and infections. If you are pregnant, quitting smoking can help to reduce your chances of having a baby of low birth weight. HOW DO I GET READY TO QUIT? When you decide to quit smoking, create a plan to make sure that you are successful. Before you quit:  Pick a date to quit. Set a date within the next two weeks to give you time to prepare.  Write down the reasons why you are quitting. Keep this list in places where you will see it often, such as on your bathroom mirror or in your car or wallet.  Identify the people, places, things, and  activities that make you want to smoke (triggers) and avoid them. Make sure to take these actions:  Throw away all cigarettes at home, at work, and in your car.  Throw away smoking accessories, such as Set designerashtrays and lighters.  Clean your car and make sure to empty the ashtray.  Clean your home, including curtains and carpets.  Tell your family, friends, and coworkers that you are quitting. Support from your loved ones can make quitting easier.  Talk with your health care provider about your options for quitting smoking.  Find out what treatment options are covered by your health insurance. WHAT STRATEGIES CAN I USE TO QUIT SMOKING?  Talk with your healthcare provider about different strategies to quit smoking. Some strategies include:  Quitting smoking altogether instead of gradually lessening how much you smoke over a period of time. Research shows that quitting "cold [place]" is more successful than gradually quitting.  Attending  in-person counseling to help you build problem-solving skills. You are more likely to have success in quitting if you attend several counseling sessions. Even short sessions of 10 minutes can be effective.  Finding resources and support systems that can help you to quit smoking and remain smoke-free after you quit. These resources are most helpful when you use them often. They can include:  Online chats with a Veterinary surgeoncounselor.  Telephone quitlines.  Printed Materials engineerself-help materials.  Support groups or group counseling.  Text messaging programs.  Mobile phone applications.  Taking medicines to help you quit smoking. (If you are pregnant or breastfeeding, talk with your health care provider first.) Some medicines contain nicotine and some do not. Both types of medicines help with cravings, but the medicines that include nicotine help to relieve withdrawal symptoms. Your health care provider may recommend:  Nicotine patches, gum, or lozenges.  Nicotine inhalers or sprays.  Non-nicotine medicine that is taken by mouth. Talk with your health care provider about combining strategies, such as taking medicines while you are also receiving in-person counseling. Using these two strategies together makes you more likely to succeed in quitting than if you used either strategy on its own. If you are pregnant or breastfeeding, talk with your health care provider about finding counseling or other support strategies to quit smoking. Do not take medicine to help you quit smoking unless told to do so by your health care provider. WHAT THINGS CAN I DO TO MAKE IT EASIER TO QUIT? Quitting smoking might feel overwhelming at first, but there is a lot that you can do to make it easier. Take these important actions:  Reach out to your family and friends and ask that they support and encourage you during this time. Call telephone quitlines, reach out to support groups, or work with a counselor for support.  Ask people who  smoke to avoid smoking around you.  Avoid places that trigger you to smoke, such as bars, parties, or smoke-break areas at work.  Spend time around people who do not smoke.  Lessen stress in your life, because stress can be a smoking trigger for some people. To lessen stress, try:  Exercising regularly.  Deep-breathing exercises.  Yoga.  Meditating.  Performing a body scan. This involves closing your eyes, scanning your body from head to toe, and noticing which parts of your body are particularly tense. Purposefully relax the muscles in those areas.  Download or purchase mobile phone or tablet apps (applications) that can help you stick to your quit plan by providing reminders, tips, and encouragement.  There are many free apps, such as QuitGuide from the [employer] developer for Disease Control and Prevention). You can find other support for quitting smoking (smoking cessation) through smokefree.gov and other websites. HOW WILL I FEEL WHEN I QUIT SMOKING? Within the first 24 hours of quitting smoking, you may start to feel some withdrawal symptoms. These symptoms are usually most noticeable 2-3 days after quitting, but they usually do not last beyond 2-3 weeks. Changes or symptoms that you might experience include:  Mood swings.  Restlessness, anxiety, or irritation.  Difficulty concentrating.  Dizziness.  Strong cravings for sugary foods in addition to nicotine.  Mild weight gain.  Constipation.  Nausea.  Coughing or a sore throat.  Changes in how your medicines work in your body.  A depressed mood.  Difficulty sleeping (insomnia). After the first 2-3 weeks of quitting, you may start to notice more positive results, such as:  Improved sense of smell and taste.  Decreased coughing and sore throat.  Slower heart rate.  Lower blood pressure.  Clearer skin.  The ability to breathe more easily.  Fewer sick days. Quitting smoking is very challenging for most people. Do  not get discouraged if you are not successful the first time. Some people need to make many attempts to quit before they achieve long-term success. Do your best to stick to your quit plan, and talk with your health care provider if you have any questions or concerns.   This information is not intended to replace advice given to you by your health care provider. Make sure you discuss any questions you have with your health care provider.   Document Released: 05/13/2001 Document Revised: 10/03/2014 Document Reviewed: 10/03/2014 Elsevier Interactive Patient Education [employer]

## 2015-11-02 NOTE — Assessment & Plan Note (Addendum)
Refer to psychiatrist; I am not comfortable continuing her on long-term benzodiazepine as her previous doctor was prescribing; [facility] reviewed; last alprazolam 0.5 mg filled on 08/20/2015 for 90 pills; prior to that, 90 pills on 07/20/2015, then 90 pills on 06/22/2015, then 90 pills on 05/25/2015

## 2015-11-02 NOTE — Progress Notes (Signed)
BP 134/68 mmHg  Pulse 93  Temp(Src) 98.4 F (36.9 C) (Oral)  Resp 18  Ht '5\' 6"'  (1.676 m)  Wt 129 lb 14.4 oz (58.922 kg)  BMI 20.98 kg/m2  SpO2 96%   Subjective:    Patient ID: Clinton Sawyer, female    DOB: 1965/07/13, 50 y.o.   MRN: [id number]  HPI: DARIANNA AMY is a 50 y.o. female  Chief Complaint  Patient presents with  . Medication Refill   Patient is here for follow-up, but she is a new patient to me; her usual provider left the office  Needs refill of zoloft and xanax Used to see Dr. Nadine Counts (who has left this practice) Has been on zoloft for about 20 years; started with bad marriage in the 1980's, anxiety and depression Dose has been stable for a while; she has no energy, no energy to get out of bed, to get dressed Lives with sister who just lost her leg to diabetes Son tried to have her committed; they saw her in the ER and sent her home; not working a psychiatrist No self-harm; thinks about strangling her son some days, half-joking She has been on xanax for a long time; does not drink any alcohol; not interested in weaning at this time; same dose for a long time; takes for anxiety only; taking exactly as prescribed  Potassium was low, see previous labs No leg cramps or palpitations High alk phos, on dilantin for a long time, seizure d/o; seeing neurologist, Dr. Mamie Nick CBC normal; glucose and kidneys normal Smoker; not quite ready to quit  Depression screen [facility] 2/9 11/02/2015 04/05/2015  Decreased Interest 0 0  Down, Depressed, Hopeless 0 0  PHQ - 2 Score 0 0   Relevant past medical, surgical, family and social history reviewed Past Medical History  Diagnosis Date  . Hyperlipidemia, acquired   . Heavy smoker   . Chronic major depressive disorder ([place])   . Anxiety   . Seizures ([place])   . History of TIA (transient ischemic attack)   . Dysuria   . Urinary incontinence in female    Past Surgical History  Procedure Laterality Date  . Tubal ligation     Family  History  Problem Relation Age of Onset  . Kidney disease Mother   . Hypertension Mother   . Depression Mother   . Diabetes Mother   . Stroke Father   . Hypertension Father   . Cancer Father     thyroid  . Diabetes Father   . Diabetes Sister   . Diabetes Brother    Social History  Substance Use Topics  . Smoking status: Current Every Day Smoker -- 1.00 packs/day    Types: Cigarettes  . Smokeless tobacco: None  . Alcohol Use: No   Interim medical history since last visit reviewed. Allergies and medications reviewed  Review of Systems Per HPI unless specifically indicated above     Objective:    BP 134/68 mmHg  Pulse 93  Temp(Src) 98.4 F (36.9 C) (Oral)  Resp 18  Ht '5\' 6"'  (1.676 m)  Wt 129 lb 14.4 oz (58.922 kg)  BMI 20.98 kg/m2  SpO2 96%  Wt Readings from Last 3 Encounters:  11/02/15 129 lb 14.4 oz (58.922 kg)  10/05/15 120 lb (54.432 kg)  04/05/15 144 lb 4.8 oz (65.454 kg)    Physical Exam  Constitutional: She appears well-developed and well-nourished. No distress.  Rather dissheveled  HENT:  Head: Normocephalic and atraumatic.  Eyes:  EOM are normal. No scleral icterus.  Neck: No thyromegaly present.  Cardiovascular: Normal rate, regular rhythm and normal heart sounds.   No murmur heard. Pulmonary/Chest: Effort normal and breath sounds normal. No respiratory distress.  Abdominal: Soft. Bowel sounds are normal. She exhibits no distension.  Musculoskeletal: She exhibits no edema.  Neurological: She is alert. She exhibits normal muscle tone.  Skin: Skin is warm and dry. She is not diaphoretic. No pallor.  Psychiatric: Her behavior is normal. Judgment and thought content normal. Her mood appears anxious. Her affect is not inappropriate. She is not agitated and not aggressive. She does not exhibit a depressed mood.  Good eye contact with examiner; casual outfit; fair hygiene She is attentive.      Assessment & Plan:   Problem List Items Addressed This Visit       Nervous and Auditory   Seizure disorder ([place])    Followed by neurologist        Other   Alkaline phosphatase elevation    Noted on previous labs; will recheck and get alk phos isoenzymes      Relevant Orders   Hepatic function panel (Completed)   Alkaline phosphatase, isoenzymes (Completed)   Benzodiazepine use agreement exists    Explained serious warning of combining opiates with benzos, in case pt ever has surgery or injury or is given Rx for anything containing a narcotic; could cause accidental fatal overdose; gave pt a copy of the Aug 2016 FDA press release regarding this; if taking daily, do not ever stop abruptly b/c of risk of seizures; talk to provider about weaning down; never ever mix with alcohol, and do not combine benzo with alcohol within six hours of each other      Relevant Orders   ToxASSURE Select 13 (MW), Urine   Controlled substance agreement signed    Discussed risk of controlled substances including possible unintentional overdose, especially if mixed with alcohol or other pills; typical speech given including illegal to share, even out of the goodness of patient's heart, always keep in the original bottle, safeguard medicine, do NOT mix with alcohol, other pain pills, "nerve" or anxiety pills, or sleeping pills; I am not obligated to approve of early refill or give new prescription if medicine is lost, stolen, or destroyed even with a police report, etc.; patient agrees with plan; controlled substance contract signed; copy of contract given to patient      Relevant Orders   ToxASSURE Select 13 (MW), Urine   Fatigue   Relevant Orders   TSH (Completed)   Vitamin B12 (Completed)   VITAMIN D 25 Hydroxy (Vit-D Deficiency, Fractures) (Completed)   Heavy tobacco smoker    Encouraged her to consider quitting smoking; she is not ready to quit; I am here to help if/when she wants to stop      Hyperlipidemia LDL goal <100    Check lipids      Relevant Orders    Lipid Panel w/o Chol/HDL Ratio (Completed)   Major depressive disorder, recurrent episode, in partial remission with anxious distress ([place]) - Primary    Refer to psychiatrist; I am not comfortable continuing her on long-term benzodiazepine as her previous doctor was prescribing; [place] reviewed; last alprazolam 0.5 mg filled on 08/20/2015 for 90 pills; prior to that, 90 pills on 07/20/2015, then 90 pills on 06/22/2015, then 90 pills on 05/25/2015      Relevant Medications   sertraline (ZOLOFT) 100 MG tablet   ALPRAZolam (XANAX) 0.5 MG tablet  Other Relevant Orders   Ambulatory referral to Psychiatry    Other Visit Diagnoses    Screening for HIV (human immunodeficiency virus)        Relevant Orders    HIV antibody (Completed)    Need for hepatitis C screening test        Relevant Orders    Hepatitis C antibody (Completed)       Follow up plan: Return in about 3 weeks (around 11/23/2015) for medication follow-up.  An after-visit summary was printed and given to the patient at [place].  Please see the patient instructions which may contain other information and recommendations beyond what is mentioned above in the assessment and plan.  Meds ordered this encounter  Medications  . sertraline (ZOLOFT) 100 MG tablet    Sig: Take 2 tablets (200 mg total) by mouth daily.    Dispense:  60 tablet    Refill:  0    We're increasing the dose  . ALPRAZolam (XANAX) 0.5 MG tablet    Sig: Take 1 tablet (0.5 mg total) by mouth every 8 (eight) hours as needed.    Dispense:  90 tablet    Refill:  0   Orders Placed This Encounter  Procedures  . Hepatic function panel  . Alkaline phosphatase, isoenzymes  . HIV antibody  . Hepatitis C antibody  . Lipid Panel w/o Chol/HDL Ratio  . TSH  . Vitamin B12  . VITAMIN D 25 Hydroxy (Vit-D Deficiency, Fractures)  . ToxASSURE Select 13 (MW), Urine  . Ambulatory referral to Psychiatry

## 2015-11-07 ENCOUNTER — Telehealth: Payer: Self-pay | Admitting: Family Medicine

## 2015-11-07 DIAGNOSIS — E559 Vitamin D deficiency, unspecified: Secondary | ICD-10-CM

## 2015-11-07 DIAGNOSIS — R748 Abnormal levels of other serum enzymes: Secondary | ICD-10-CM

## 2015-11-07 LAB — HEPATIC FUNCTION PANEL
ALBUMIN: 4.1 g/dL (ref 3.5–5.5)
ALT: 5 IU/L (ref 0–32)
AST: 10 IU/L (ref 0–40)
Alkaline Phosphatase: 180 IU/L — ABNORMAL HIGH (ref 39–117)
BILIRUBIN TOTAL: 0.2 mg/dL (ref 0.0–1.2)
Bilirubin, Direct: 0.06 mg/dL (ref 0.00–0.40)
Total Protein: 6.4 g/dL (ref 6.0–8.5)

## 2015-11-07 LAB — LIPID PANEL W/O CHOL/HDL RATIO
CHOLESTEROL TOTAL: 216 mg/dL — AB (ref 100–199)
HDL: 65 mg/dL (ref >39)
LDL CALC: 130 mg/dL — AB (ref 0–99)
TRIGLYCERIDES: 106 mg/dL (ref 0–149)
VLDL Cholesterol Cal: 21 mg/dL (ref 5–40)

## 2015-11-07 LAB — HIV ANTIBODY (ROUTINE TESTING W REFLEX): HIV SCREEN 4TH GENERATION: NONREACTIVE

## 2015-11-07 LAB — ALKALINE PHOSPHATASE, ISOENZYMES
BONE FRACTION: 28 % (ref 14–68)
INTESTINAL FRAC.: 3 % (ref 0–18)
LIVER FRACTION: 69 % (ref 18–85)

## 2015-11-07 LAB — HEPATITIS C ANTIBODY: Hep C Virus Ab: <0.1 s/co ratio (ref 0.0–0.9)

## 2015-11-07 LAB — TSH: TSH: 1.71 u[IU]/mL (ref 0.450–4.500)

## 2015-11-07 LAB — VITAMIN B12: Vitamin B-12: 319 pg/mL (ref 211–946)

## 2015-11-07 LAB — VITAMIN D 25 HYDROXY (VIT D DEFICIENCY, FRACTURES): VIT D 25 HYDROXY: 9.6 ng/mL — AB (ref 30.0–100.0)

## 2015-11-07 NOTE — Telephone Encounter (Signed)
Vitamin D is low Alk phos points to liver source I will call pt tomorrow

## 2015-11-07 NOTE — Telephone Encounter (Signed)
Alk phos has gone up, isoenzymes pending

## 2015-11-09 ENCOUNTER — Encounter: Payer: Self-pay | Admitting: Family Medicine

## 2015-11-09 NOTE — Assessment & Plan Note (Signed)

## 2015-11-09 NOTE — Telephone Encounter (Signed)
I left message.

## 2015-11-09 NOTE — Assessment & Plan Note (Signed)
Explained serious warning of combining opiates with benzos, in case pt ever has surgery or injury or is given Rx for anything containing a narcotic; could cause accidental fatal overdose; gave pt a copy of the Aug 2016 FDA press release regarding this; if taking daily, do not ever stop abruptly b/c of risk of seizures; talk to provider about weaning down; never ever mix with alcohol, and do not combine benzo with alcohol within six hours of each other 

## 2015-11-09 NOTE — Assessment & Plan Note (Signed)
Noted on previous labs; will recheck and get alk phos isoenzymes 

## 2015-11-09 NOTE — Assessment & Plan Note (Signed)
Encouraged her to consider quitting smoking; she is not ready to quit; I am here to help if/when she wants to stop

## 2015-11-09 NOTE — Assessment & Plan Note (Signed)
Followed by neurologist 

## 2015-11-10 LAB — TOXASSURE SELECT 13 (MW), URINE: PDF: 0

## 2015-11-12 ENCOUNTER — Encounter: Payer: Self-pay | Admitting: Family Medicine

## 2015-11-12 DIAGNOSIS — F1290 Cannabis use, unspecified, uncomplicated: Secondary | ICD-10-CM | POA: Insufficient documentation

## 2015-11-12 DIAGNOSIS — E559 Vitamin D deficiency, unspecified: Secondary | ICD-10-CM | POA: Insufficient documentation

## 2015-11-12 DIAGNOSIS — Z9114 Patient's other noncompliance with medication regimen: Secondary | ICD-10-CM | POA: Insufficient documentation

## 2015-11-12 MED ORDER — VITAMIN D (ERGOCALCIFEROL) 1.25 MG (50000 UNIT) PO CAPS: 50000.0000 [IU] | ORAL_CAPSULE | ORAL | Status: AC

## 2015-11-12 NOTE — Telephone Encounter (Signed)
Please let her know that we'll get an US of her liver and have her see gastroenterology; one of her liver enzymes has gone up; it might be one of her medicines, but it's climbed pretty quickly, so we want to make sure Start vit D Rx please

## 2015-11-12 NOTE — Assessment & Plan Note (Signed)
Will refer to GI, get liver [place]

## 2015-11-13 ENCOUNTER — Telehealth: Payer: Self-pay | Admitting: Gastroenterology

## 2015-11-13 ENCOUNTER — Telehealth: Payer: Self-pay

## 2015-11-13 NOTE — Telephone Encounter (Signed)
[facility] June 19 @ 9:15

## 2015-11-13 NOTE — Telephone Encounter (Signed)
I have called patient to make an appointment per referral to GI for Alkaline phosphatase elevation. No answer. I have left a detailed message.

## 2015-11-14 NOTE — Telephone Encounter (Signed)
Patient has called back and did not want to make an appointment at this time. I have sent a message to PCP.

## 2015-11-16 NOTE — Telephone Encounter (Signed)
Pt.notified

## 2015-11-19 ENCOUNTER — Ambulatory Visit: Admission: RE | Admit: 2015-11-19 | Payer: Medicare HMO | Source: Ambulatory Visit

## 2015-11-21 ENCOUNTER — Telehealth: Payer: Self-pay | Admitting: Family Medicine

## 2015-11-21 NOTE — Telephone Encounter (Signed)
I have called patient to make an appointment per referral for GI (Alkaline phosphatase elevation). No answer. I have left a detailed message on voicemail.

## 2015-11-22 ENCOUNTER — Ambulatory Visit: Payer: Self-pay | Admitting: Family Medicine

## 2015-11-22 ENCOUNTER — Telehealth: Payer: Self-pay | Admitting: Family Medicine

## 2015-11-22 NOTE — Telephone Encounter (Signed)
ZOX:[id number]:Tried calling pt to schedule an appt [facility].

## 2015-11-26 ENCOUNTER — Encounter: Payer: Self-pay | Admitting: Gastroenterology

## 2015-12-05 NOTE — Telephone Encounter (Signed)
This is still an open document; please see request below; thank you

## 2015-12-14 NOTE — Telephone Encounter (Signed)
Please see below - thank you!

## 2016-01-04 ENCOUNTER — Other Ambulatory Visit: Payer: Self-pay

## 2016-01-04 MED ORDER — SERTRALINE HCL 100 MG PO TABS: 200.0000 mg | ORAL_TABLET | Freq: Every day | ORAL | 0 refills | Status: DC

## 2016-01-04 NOTE — Telephone Encounter (Signed)
appt needed; she did not come in for f/u; did have US done; has not complied with treatment; I approved 5 days of med but we need to hear from her

## 2016-02-07 NOTE — Telephone Encounter (Signed)
Please see notes.

## 2016-02-08 ENCOUNTER — Encounter: Payer: Self-pay | Admitting: Family Medicine

## 2016-02-08 NOTE — Telephone Encounter (Signed)
Patient has not returned calls; will send letter

## 2016-02-08 NOTE — Telephone Encounter (Signed)
Left detailed message, u/s was scheduled but she must of canceled?

## 2016-02-27 ENCOUNTER — Telehealth: Payer: Self-pay | Admitting: Family Medicine

## 2016-02-27 NOTE — Telephone Encounter (Signed)
Left voicemail to patient with scheduling phone #

## 2016-02-27 NOTE — Telephone Encounter (Signed)
Patient states that you where wanting her to get an ultra sound of the liver however she is wanting to cancel that appointment and reschedule it, however, I do not see anything in her chart pertaining to it.

## 2016-02-29 ENCOUNTER — Other Ambulatory Visit: Payer: Self-pay | Admitting: Family Medicine

## 2016-02-29 NOTE — Telephone Encounter (Signed)
Left voice message.

## 2016-02-29 NOTE — Telephone Encounter (Signed)
Patient needs to be seen I will approve five days of medicine, but she needs an appointment

## 2016-03-05 ENCOUNTER — Ambulatory Visit: Admission: RE | Admit: 2016-03-05 | Payer: Medicare HMO | Source: Ambulatory Visit

## 2016-03-08 ENCOUNTER — Other Ambulatory Visit: Payer: Self-pay | Admitting: Family Medicine

## 2016-03-08 NOTE — Telephone Encounter (Signed)
We have made attempts to get patient in for appt, labs, US She is not complying She has a visit on 03/18/16 I'll allow enough medicine to get her through to that appt, but I really need to see her and get labs

## 2016-03-18 ENCOUNTER — Encounter: Payer: Self-pay | Admitting: Family Medicine

## 2016-03-18 ENCOUNTER — Ambulatory Visit (INDEPENDENT_AMBULATORY_CARE_PROVIDER_SITE_OTHER): Payer: Medicare HMO | Admitting: Family Medicine

## 2016-03-18 DIAGNOSIS — F1220 Cannabis dependence, uncomplicated: Secondary | ICD-10-CM

## 2016-03-18 DIAGNOSIS — F418 Other specified anxiety disorders: Secondary | ICD-10-CM | POA: Diagnosis not present

## 2016-03-18 DIAGNOSIS — F3341 Major depressive disorder, recurrent, in partial remission: Secondary | ICD-10-CM | POA: Diagnosis not present

## 2016-03-18 DIAGNOSIS — F17200 Nicotine dependence, unspecified, uncomplicated: Secondary | ICD-10-CM

## 2016-03-18 DIAGNOSIS — E785 Hyperlipidemia, unspecified: Secondary | ICD-10-CM

## 2016-03-18 DIAGNOSIS — E559 Vitamin D deficiency, unspecified: Secondary | ICD-10-CM | POA: Diagnosis not present

## 2016-03-18 DIAGNOSIS — R634 Abnormal weight loss: Secondary | ICD-10-CM

## 2016-03-18 DIAGNOSIS — F1290 Cannabis use, unspecified, uncomplicated: Secondary | ICD-10-CM

## 2016-03-18 DIAGNOSIS — G40909 Epilepsy, unspecified, not intractable, without status epilepticus: Secondary | ICD-10-CM

## 2016-03-18 DIAGNOSIS — Z0000 Encounter for general adult medical examination without abnormal findings: Secondary | ICD-10-CM | POA: Insufficient documentation

## 2016-03-18 DIAGNOSIS — R748 Abnormal levels of other serum enzymes: Secondary | ICD-10-CM

## 2016-03-18 DIAGNOSIS — Z5181 Encounter for therapeutic drug level monitoring: Secondary | ICD-10-CM | POA: Diagnosis not present

## 2016-03-18 MED ORDER — SERTRALINE HCL 100 MG PO TABS: 200.0000 mg | ORAL_TABLET | Freq: Every day | ORAL | 1 refills | Status: DC

## 2016-03-18 NOTE — Patient Instructions (Addendum)
Please have labs done at the hospital today at [facility] entrance or return tomorrow for fasting labs I do encourage you to quit smoking Call [phone] to sign up for smoking cessation classes You can call 1-800-QUIT-NOW to talk with a smoking cessation coach Try to limit saturated fats in your diet (bologna, hot dogs, barbeque, cheeseburgers, hamburgers, steak, bacon, sausage, cheese, etc.) and get more fresh fruits, vegetables, and whole grains Please do start to work with a psychiatrist, so talk with staff here about an appointment  Smoking Hazards Smoking cigarettes is extremely bad for your health. Tobacco smoke has over 200 known poisons in it. It contains the poisonous gases nitrogen oxide and carbon monoxide. There are over 60 chemicals in tobacco smoke that cause cancer. Some of the chemicals found in cigarette smoke include:   Cyanide.   Benzene.   Formaldehyde.   Methanol (wood alcohol).   Acetylene (fuel used in welding torches).   Ammonia.  Even smoking lightly shortens your life expectancy by several years. You can greatly reduce the risk of medical problems for you and your family by stopping now. Smoking is the most preventable cause of death and disease in our society. Within days of quitting smoking, your circulation improves, you decrease the risk of having a heart attack, and your lung capacity improves. There may be some increased phlegm in the first few days after quitting, and it may take months for your lungs to clear up completely. Quitting for 10 years reduces your risk of developing lung cancer to almost that of a nonsmoker.  WHAT ARE THE RISKS OF SMOKING? Cigarette smokers have an increased risk of many serious medical problems, including:  Lung cancer.   Lung disease (such as pneumonia, bronchitis, and emphysema).   Heart attack and chest pain due to the heart not getting enough oxygen (angina).   Heart disease and peripheral blood vessel  disease.   Hypertension.   Stroke.   Oral cancer (cancer of the lip, mouth, or voice box).   Bladder cancer.   Pancreatic cancer.   Cervical cancer.   Pregnancy complications, including premature birth.   Stillbirths and smaller newborn babies, birth defects, and genetic damage to sperm.   Early menopause.   Lower estrogen level for women.   Infertility.   Facial wrinkles.   Blindness.   Increased risk of broken bones (fractures).   Senile dementia.   Stomach ulcers and internal bleeding.   Delayed wound healing and increased risk of complications during surgery. Because of secondhand smoke exposure, children of smokers have an increased risk of the following:   Sudden infant death syndrome (SIDS).   Respiratory infections.   Lung cancer.   Heart disease.   Ear infections.  WHY IS SMOKING ADDICTIVE? Nicotine is the chemical agent in tobacco that is capable of causing addiction or dependence. When you smoke and inhale, nicotine is absorbed rapidly into the bloodstream through your lungs. Both inhaled and noninhaled nicotine may be addictive.  WHAT ARE THE BENEFITS OF QUITTING?  There are many health benefits to quitting smoking. Some are:   The likelihood of developing cancer and heart disease decreases. Health improvements are seen almost immediately.   Blood pressure, pulse rate, and breathing patterns start returning to normal soon after quitting.   People who quit may see an improvement in their overall quality of life.  HOW DO YOU QUIT SMOKING? Smoking is an addiction with both physical and psychological effects, and longtime habits can be hard  to change. Your health care provider can recommend:  Programs and community resources, which may include group support, education, or therapy.  Replacement products, such as patches, gum, and nasal sprays. Use these products only as directed. Do not replace cigarette smoking with  electronic cigarettes (commonly called e-cigarettes). The safety of e-cigarettes is unknown, and some may contain harmful chemicals. FOR MORE INFORMATION  American Lung Association: www.lung.org  American Cancer Society: www.cancer.org   This information is not intended to replace advice given to you by your health care provider. Make sure you discuss any questions you have with your health care provider.   Document Released: 06/26/2004 Document Revised: 03/09/2013 Document Reviewed: 11/08/2012 Elsevier Interactive Patient Education [employer]

## 2016-03-18 NOTE — Progress Notes (Signed)
BP 118/70   Pulse 96   Temp 98.6 F (37 C)   Resp 16   Wt 117 lb 4 oz (53.2 kg)   SpO2 97%   BMI 18.92 kg/m    Subjective:    Patient ID: Kim Gallagher, female    DOB: 22-Feb-1966, 50 y.o.   MRN: [id number]  HPI: Kim Gallagher is a 50 y.o. female  Chief Complaint  Patient presents with  . Medication Refill    Patient here for f/u; she was due for a return visit a few months ago and was pressed to come in for a visit  Anxiety has been "out the roof" she says; I asked how she is dealing with stress; she smokes a little weed She drinks coffee and Coke, more than 5 per day She does smoke; 1 ppd; not ready to quit She missed some doses of sertaline, about a week in total  She is tachycardic upon check in; recheck pulse was 96 No chest pain; not conscious of fast rate or palpitations She is stressed b/c she is being evicted on Thursday, going to the [employer] to live  No seizure activity; sees Dr. Demetrius Charity in [facility]; last seizure was 2 years ago; saw him last year; Dr. Demetrius Charity sees her once a year  High cholesterol; eating fatty meats; "love me some cheese"; not many eggs; no milk  Sprained her right ankle last week; does not think anything is broken  Depression screen [facility] 2/9 03/18/2016 11/02/2015 04/05/2015  Decreased Interest 0 0 0  Down, Depressed, Hopeless 0 0 0  PHQ - 2 Score 0 0 0   Relevant past medical, surgical, family and social history reviewed Past Medical History:  Diagnosis Date  . Anxiety   . Chronic major depressive disorder   . Dysuria   . Heavy smoker   . History of TIA (transient ischemic attack)   . Hyperlipidemia, acquired   . Seizures (HCC)   . Urinary incontinence in female    Past Surgical History:  Procedure Laterality Date  . TUBAL LIGATION     Family History  Problem Relation Age of Onset  . Kidney disease Mother   . Hypertension Mother   . Depression Mother   . Diabetes Mother   . Stroke Father   . Hypertension Father   . Cancer Father    thyroid  . Diabetes Father   . Diabetes Sister   . Diabetes Brother    Social History  Substance Use Topics  . Smoking status: Current Every Day Smoker    Packs/day: 1.00    Types: Cigarettes  . Smokeless tobacco: Not on file  . Alcohol use No  MD note: smokes marijuana  Interim medical history since last visit reviewed. Allergies and medications reviewed  Review of Systems  Constitutional: Positive for unexpected weight change.  HENT: Negative for hearing loss.   Eyes: Negative for visual disturbance.  Respiratory: Negative for shortness of breath.   Cardiovascular: Negative for chest pain and palpitations.  Gastrointestinal: Negative for abdominal pain, blood in stool and nausea.  Endocrine: Positive for polydipsia and polyuria.  Genitourinary: Negative for dysuria and hematuria.  Musculoskeletal: Negative for arthralgias.  Neurological: Negative for tremors, seizures (last sz was 2 years ago) and weakness.  Psychiatric/Behavioral: Positive for sleep disturbance (wakes up frequently to urinating).  weight loss, 12 pounds since June; she thinks it is all stress  Per HPI unless specifically indicated above     Objective:  BP 118/70   Pulse 96   Temp 98.6 F (37 C)   Resp 16   Wt 117 lb 4 oz (53.2 kg)   SpO2 97%   BMI 18.92 kg/m   Wt Readings from Last 3 Encounters:  03/18/16 117 lb 4 oz (53.2 kg)  11/02/15 129 lb 14.4 oz (58.9 kg)  10/05/15 120 lb (54.4 kg)    Physical Exam  Constitutional: She appears well-developed and well-nourished. No distress.  Rather dissheveled  HENT:  Head: Normocephalic and atraumatic.  Eyes: EOM are normal. No scleral icterus.  Neck: No thyromegaly present.  Cardiovascular: Normal rate, regular rhythm and normal heart sounds.   No murmur heard. Pulmonary/Chest: Effort normal and breath sounds normal. No respiratory distress.  Abdominal: Soft. Bowel sounds are normal. She exhibits no distension.  Musculoskeletal: She exhibits  no edema.  Neurological: She is alert. She exhibits normal muscle tone.  Skin: Skin is warm and dry. She is not diaphoretic. No pallor.  Skin has an unhealthy color, almost greyish  Psychiatric: Her behavior is normal. Judgment and thought content normal. Her mood appears not anxious. Her affect is not inappropriate. She is not agitated, not aggressive, not slowed, not withdrawn and not actively hallucinating. She does not exhibit a depressed mood.  Good eye contact with examiner; only fair hygiene; does not appear anxious today She is attentive.   Results for orders placed or performed in visit on 11/02/15  Hepatic function panel  Result Value Ref Range   Total Protein 6.4 6.0 - 8.5 g/dL   Albumin 4.1 3.5 - 5.5 g/dL   Bilirubin Total 0.2 0.0 - 1.2 mg/dL   Bilirubin, Direct 9.600.06 0.00 - 0.40 mg/dL   Alkaline Phosphatase 180 (H) 39 - 117 IU/L   AST 10 0 - 40 IU/L   ALT 5 0 - 32 IU/L  Alkaline phosphatase, isoenzymes  Result Value Ref Range   LIVER FRACTION 69 18 - 85 %   BONE FRACTION 28 14 - 68 %   INTESTINAL FRAC. 3 0 - 18 %  HIV antibody  Result Value Ref Range   HIV Screen 4th Generation wRfx Non Reactive Non Reactive  Hepatitis C antibody  Result Value Ref Range   Hep C Virus Ab <0.1 0.0 - 0.9 s/co ratio  Lipid Panel w/o Chol/HDL Ratio  Result Value Ref Range   Cholesterol, Total 216 (H) 100 - 199 mg/dL   Triglycerides 454106 0 - 149 mg/dL   HDL 65 >09>39 mg/dL   VLDL Cholesterol Cal 21 5 - 40 mg/dL   LDL Calculated 811130 (H) 0 - 99 mg/dL  TSH  Result Value Ref Range   TSH 1.710 0.450 - 4.500 uIU/mL  Vitamin B12  Result Value Ref Range   Vitamin B-12 319 211 - 946 pg/mL  VITAMIN D 25 Hydroxy (Vit-D Deficiency, Fractures)  Result Value Ref Range   Vit D, 25-Hydroxy 9.6 (L) 30.0 - 100.0 ng/mL  ToxASSURE Select 13 (MW), Urine  Result Value Ref Range   ToxAssure Select 13 FINAL    PDF .       Assessment & Plan:   Problem List Items Addressed This Visit      Nervous and  Auditory   Seizure disorder [facility](HCC)    [place] neurolog[facility]      Relevant Orders   Dilantin (Phenytoin) level, total     Other   Vitamin D deficiency    Check level  Therapeutic drug monitoring    Check dilantin level      Relevant Orders   Dilantin (Phenytoin) level, total   Marijuana smoker (HCC)    I will not be willing at all to prescribe any controlled substances of any kind if she is going to continue to use illicit substances; I urged her to stop and work with psychiatrist to deal with stress      Major depressive disorder, recurrent episode, in partial remission with anxious distress (HCC)    Continue sertraline; I had referred her to psychiatry in June; will ask staff to make that appt again; see AVS; I urged patient to start working with psychiatrist      Relevant Medications   sertraline (ZOLOFT) 100 MG tablet   Hyperlipidemia LDL goal <100    Check lipids      Relevant Orders   Lipid panel   Heavy tobacco smoker    Encouraged smoking cessation; see AVS      Relevant Orders   DG Chest 2 View   Alkaline phosphatase elevation    Check labs today or tomorrow at the latest, patient promises, see about etiology      Relevant Orders   COMPLETE METABOLIC PANEL WITH GFR   Alkaline Phosphatase Isoenzymes   Abnormal weight loss    Check TSH, labs, chest xray      Relevant Orders   TSH   CBC with Differential/Platelet   DG Chest 2 View    Other Visit Diagnoses   None.      Follow up plan: No Follow-up on file. -- f/u based on labs (to be drawn today or tomorrow)  An after-visit summary was printed and given to the patient at check-out.  Please see the patient instructions which may contain other information and recommendations beyond what is mentioned above in the assessment and plan.  Meds ordered this encounter  Medications  . DISCONTD: pravastatin (PRAVACHOL) 40 MG tablet    Sig: Take 40 mg by mouth.  . DISCONTD:  acetaminophen (TYLENOL) 325 MG tablet    Sig: Take by mouth.  . sertraline (ZOLOFT) 100 MG tablet    Sig: Take 2 tablets (200 mg total) by mouth daily. (please keep appointment to see the doctor)    Dispense:  60 tablet    Refill:  1    Orders Placed This Encounter  Procedures  . DG Chest 2 View  . Lipid panel  . Dilantin (Phenytoin) level, total  . COMPLETE METABOLIC PANEL WITH GFR  . TSH  . CBC with Differential/Platelet  . Alkaline Phosphatase Isoenzymes

## 2016-03-18 NOTE — Assessment & Plan Note (Signed)
Managed by neurologist, Dr[facility]P, [facility]

## 2016-03-18 NOTE — Assessment & Plan Note (Signed)
Check TSH, labs, chest xray

## 2016-03-18 NOTE — Assessment & Plan Note (Signed)
Check labs today or tomorrow at the latest, patient promises, see about etiology

## 2016-03-18 NOTE — Assessment & Plan Note (Signed)
Encouraged smoking cessation; see AVS 

## 2016-03-18 NOTE — Assessment & Plan Note (Addendum)
Continue sertraline; I had referred her to psychiatry in June; will ask staff to make that appt again; see AVS; I urged patient to start working with psychiatrist

## 2016-03-18 NOTE — Assessment & Plan Note (Signed)
Check level 

## 2016-03-18 NOTE — Assessment & Plan Note (Signed)
Check lipids 

## 2016-03-18 NOTE — Assessment & Plan Note (Signed)
Check dilantin level

## 2016-03-24 NOTE — Assessment & Plan Note (Signed)
I will not be willing at all to prescribe any controlled substances of any kind if she is going to continue to use illicit substances; I urged her to stop and work with psychiatrist to deal with stress

## 2016-03-28 LAB — CBC WITH DIFFERENTIAL/PLATELET
BASOS ABS: 0 {cells}/uL (ref 0–200)
Basophils Relative: 0 %
EOS ABS: 144 {cells}/uL (ref 15–500)
Eosinophils Relative: 2 %
HCT: 42.2 % (ref 35.0–45.0)
Hemoglobin: 14 g/dL (ref 11.7–15.5)
LYMPHS PCT: 28 %
Lymphs Abs: 2016 cells/uL (ref 850–3900)
MCH: 32 pg (ref 27.0–33.0)
MCHC: 33.2 g/dL (ref 32.0–36.0)
MCV: 96.3 fL (ref 80.0–100.0)
MONOS PCT: 6 %
MPV: 9.3 fL (ref 7.5–12.5)
Monocytes Absolute: 432 cells/uL (ref 200–950)
NEUTROS PCT: 64 %
Neutro Abs: 4608 cells/uL (ref 1500–7800)
PLATELETS: 249 10*3/uL (ref 140–400)
RBC: 4.38 MIL/uL (ref 3.80–5.10)
RDW: 15.9 % — AB (ref 11.0–15.0)
WBC: 7.2 10*3/uL (ref 3.8–10.8)

## 2016-03-29 LAB — COMPLETE METABOLIC PANEL WITH GFR
ALBUMIN: 3.7 g/dL (ref 3.6–5.1)
ALK PHOS: 114 U/L (ref 33–130)
ALT: 5 U/L — AB (ref 6–29)
AST: 10 U/L (ref 10–35)
BILIRUBIN TOTAL: 0.3 mg/dL (ref 0.2–1.2)
BUN: 5 mg/dL — AB (ref 7–25)
CO2: 25 mmol/L (ref 20–31)
Calcium: 8.7 mg/dL (ref 8.6–10.4)
Chloride: 104 mmol/L (ref 98–110)
Creat: 0.72 mg/dL (ref 0.50–1.05)
GFR, Est African American: >89 mL/min (ref >=60)
GFR, Est Non African American: >89 mL/min (ref >=60)
GLUCOSE: 107 mg/dL — AB (ref 65–99)
Potassium: 4.1 mmol/L (ref 3.5–5.3)
Sodium: 138 mmol/L (ref 135–146)
TOTAL PROTEIN: 6 g/dL — AB (ref 6.1–8.1)

## 2016-03-29 LAB — LIPID PANEL
Cholesterol: 210 mg/dL — ABNORMAL HIGH (ref 125–200)
HDL: 54 mg/dL (ref >=46)
LDL CALC: 130 mg/dL — AB (ref <130)
Total CHOL/HDL Ratio: 3.9 Ratio (ref <=5.0)
Triglycerides: 130 mg/dL (ref <150)
VLDL: 26 mg/dL (ref <30)

## 2016-03-29 LAB — PHENYTOIN LEVEL, TOTAL: PHENYTOIN LVL: 12.8 ug/mL (ref 10.0–20.0)

## 2016-03-29 LAB — TSH: TSH: 1.2 mIU/L

## 2016-04-01 LAB — ALKALINE PHOSPHATASE ISOENZYMES
Alkaline Phonsphatase: 122 U/L (ref 33–130)
Bone Isoenzymes: 38 % (ref 28–66)
Intestinal Isoenzymes: 0 % — ABNORMAL LOW (ref 1–24)
LIVER ISOENZYMES (ALP ISO): 62 % (ref 25–69)

## 2016-04-29 ENCOUNTER — Ambulatory Visit (INDEPENDENT_AMBULATORY_CARE_PROVIDER_SITE_OTHER): Payer: Medicare HMO | Admitting: Family Medicine

## 2016-04-29 ENCOUNTER — Other Ambulatory Visit: Payer: Self-pay | Admitting: Family Medicine

## 2016-04-29 ENCOUNTER — Telehealth: Payer: Self-pay

## 2016-04-29 ENCOUNTER — Encounter: Payer: Self-pay | Admitting: Family Medicine

## 2016-04-29 DIAGNOSIS — F17200 Nicotine dependence, unspecified, uncomplicated: Secondary | ICD-10-CM | POA: Diagnosis not present

## 2016-04-29 DIAGNOSIS — N899 Noninflammatory disorder of vagina, unspecified: Secondary | ICD-10-CM

## 2016-04-29 DIAGNOSIS — Z124 Encounter for screening for malignant neoplasm of cervix: Secondary | ICD-10-CM

## 2016-04-29 DIAGNOSIS — Z87898 Personal history of other specified conditions: Secondary | ICD-10-CM

## 2016-04-29 DIAGNOSIS — R748 Abnormal levels of other serum enzymes: Secondary | ICD-10-CM | POA: Diagnosis not present

## 2016-04-29 DIAGNOSIS — F3341 Major depressive disorder, recurrent, in partial remission: Secondary | ICD-10-CM

## 2016-04-29 DIAGNOSIS — R634 Abnormal weight loss: Secondary | ICD-10-CM

## 2016-04-29 DIAGNOSIS — N63 Unspecified lump in unspecified breast: Secondary | ICD-10-CM | POA: Diagnosis not present

## 2016-04-29 DIAGNOSIS — R61 Generalized hyperhidrosis: Secondary | ICD-10-CM | POA: Diagnosis not present

## 2016-04-29 DIAGNOSIS — Z8742 Personal history of other diseases of the female genital tract: Secondary | ICD-10-CM

## 2016-04-29 DIAGNOSIS — N898 Other specified noninflammatory disorders of vagina: Secondary | ICD-10-CM | POA: Insufficient documentation

## 2016-04-29 DIAGNOSIS — F418 Other specified anxiety disorders: Principal | ICD-10-CM

## 2016-04-29 HISTORY — DX: Other specified noninflammatory disorders of vagina: N89.8

## 2016-04-29 HISTORY — DX: Unspecified lump in unspecified breast: N63.0

## 2016-04-29 NOTE — Assessment & Plan Note (Addendum)
Lateral right wall near cervix but did not feel or appear contiguous; refer to GYN

## 2016-04-29 NOTE — Assessment & Plan Note (Signed)
Previously higher, with stronger liver isoenzymes; patient still has not had liver US; will now be getting CT scan from chest to pelvis which will obviously include liver to look for malignancy

## 2016-04-29 NOTE — Progress Notes (Signed)
BP 98/60   Pulse 98   Temp 98 F (36.7 C) (Oral)   Resp 16   Wt 112 lb (50.8 kg)   SpO2 94%   BMI 18.08 kg/m    Subjective:    Patient ID: Kim Gallagher, female    DOB: 1966/02/29, 50 y.o.   MRN: [id number]  HPI: Kim Gallagher is a 50 y.o. female  Chief Complaint  Patient presents with  . Follow-up   Patient is here for follow-up; she has not gotten her liver ultrasound yet She is losing more weight; making herself eat; pancake for breakfast; homeless, staying with a friend, does have access to good food No abdominal pain; no nausea No blood in the stool or urine Alk phos was elevated months ago, last check was normal, but liver fraction was the highest Her father had thyroid cancer; they had to cut his neck and take his thyroid out Last TSH normal Vitamin D was 9.6 in June; taking extra vit D Having sweats 24 hours a day  Depression screen [facility] 2/9 04/29/2016 03/18/2016 11/02/2015 04/05/2015  Decreased Interest 1 0 0 0  Down, Depressed, Hopeless 3 0 0 0  PHQ - 2 Score 4 0 0 0  Altered sleeping 3 - - -  Tired, decreased energy 3 - - -  Change in appetite 3 - - -  Feeling bad or failure about yourself  3 - - -  Trouble concentrating 3 - - -  Moving slowly or fidgety/restless 0 - - -  Suicidal thoughts 0 - - -  PHQ-9 Score 19 - - -  Difficult doing work/chores Somewhat difficult - - -   Relevant past medical, surgical, family and social history reviewed Past Medical History:  Diagnosis Date  . Anxiety   . Chronic major depressive disorder   . Dysuria   . Heavy smoker   . History of TIA (transient ischemic attack)   . Hyperlipidemia, acquired   . Seizures ([place])   . Urinary incontinence in female    Past Surgical History:  Procedure Laterality Date  . TUBAL LIGATION     Family History  Problem Relation Age of Onset  . Kidney disease Mother   . Hypertension Mother   . Depression Mother   . Diabetes Mother   . Stroke Father   . Hypertension Father   . Cancer  Father     thyroid  . Diabetes Father   . Diabetes Sister   . Diabetes Brother    Social History  Substance Use Topics  . Smoking status: Current Every Day Smoker    Packs/day: 1.00    Types: Cigarettes  . Smokeless tobacco: Never Used  . Alcohol use No   Interim medical history since last visit reviewed. Allergies and medications reviewed  Review of Systems Per HPI unless specifically indicated above     Objective:    BP 98/60   Pulse 98   Temp 98 F (36.7 C) (Oral)   Resp 16   Wt 112 lb (50.8 kg)   SpO2 94%   BMI 18.08 kg/m   Wt Readings from Last 3 Encounters:  04/29/16 112 lb (50.8 kg)  03/18/16 117 lb 4 oz (53.2 kg)  11/02/15 129 lb 14.4 oz (58.9 kg)    Physical Exam  Constitutional: She appears well-developed and well-nourished.  Significant weight loss noted  HENT:  Head: Normocephalic and atraumatic.  Mouth/Throat: No oropharyngeal exudate.  Eyes: Conjunctivae and EOM are normal. Right eye  exhibits no hordeolum. Left eye exhibits no hordeolum. No scleral icterus.  Neck: Carotid bruit is not present. No thyroid mass and no thyromegaly present.  Cardiovascular: Normal rate, regular rhythm, S1 normal, S2 normal and normal heart sounds.   No extrasystoles are present.  Pulmonary/Chest: Effort normal and breath sounds normal. No respiratory distress. Right breast exhibits mass. Right breast exhibits no inverted nipple, no nipple discharge, no skin change and no tenderness. Left breast exhibits mass. Left breast exhibits no inverted nipple, no nipple discharge, no skin change and no tenderness. Breasts are symmetrical.    Dense tissue versus symmetric masses in the upper outer quadrants of both breasts, smaller dense palpable area just medial to areola on the left breast; no overlying skin changes  Abdominal: Soft. Normal appearance and bowel sounds are normal. She exhibits no distension, no abdominal bruit, no pulsatile midline mass and no mass. There is no  hepatosplenomegaly. There is no tenderness. No hernia.  Genitourinary: Uterus normal. Pelvic exam was performed with patient prone. There is no rash or lesion on the right labia. There is no rash or lesion on the left labia. Uterus is not deviated, not enlarged and not tender. Cervix exhibits no motion tenderness, no discharge and no friability. Right adnexum displays no mass, no tenderness and no fullness. Left adnexum displays no mass, no tenderness and no fullness.    Genitourinary Comments: Palpable and visible mass RIGHT wall of vagina near the cervix but does not appear or feel attached / contiguous; larger than pea, smaller than lima bean  Musculoskeletal: Normal range of motion. She exhibits no edema.  Lymphadenopathy:       Head (right side): No submandibular adenopathy present.       Head (left side): No submandibular adenopathy present.    She has no cervical adenopathy.    She has no axillary adenopathy.  Neurological: She is alert. She displays no tremor. No cranial nerve deficit. She exhibits normal muscle tone. Gait normal.  Reflex Scores:      Patellar reflexes are 3+ on the right side and 3+ on the left side. Skin: Skin is warm and dry. No bruising and no ecchymosis noted. No cyanosis. No pallor.  Skin is dry, weathered aged look  Psychiatric: Her speech is normal and behavior is normal. Thought content normal. Her mood appears not anxious. She does not exhibit a depressed mood.   Results for orders placed or performed in visit on 03/18/16  Lipid panel  Result Value Ref Range   Cholesterol 210 (H) 125 - 200 mg/dL   Triglycerides 130 <150 mg/dL   HDL 54 >=46 mg/dL   Total CHOL/HDL Ratio 3.9 <=5.0 Ratio   VLDL 26 <30 mg/dL   LDL Cholesterol 130 (H) <130 mg/dL  Dilantin (Phenytoin) level, total  Result Value Ref Range   Phenytoin Lvl 12.8 10.0 - 20.0 ug/mL  COMPLETE METABOLIC PANEL WITH GFR  Result Value Ref Range   Sodium 138 135 - 146 mmol/L   Potassium 4.1 3.5 - 5.3  mmol/L   Chloride 104 98 - 110 mmol/L   CO2 25 20 - 31 mmol/L   Glucose, Bld 107 (H) 65 - 99 mg/dL   BUN 5 (L) 7 - 25 mg/dL   Creat 0.72 0.50 - 1.05 mg/dL   Total Bilirubin 0.3 0.2 - 1.2 mg/dL   Alkaline Phosphatase 114 33 - 130 U/L   AST 10 10 - 35 U/L   ALT 5 (L) 6 - 29 U/L  Total Protein 6.0 (L) 6.1 - 8.1 g/dL   Albumin 3.7 3.6 - 5.1 g/dL   Calcium 8.7 8.6 - 10.4 mg/dL   GFR, Est African American >89 >=60 mL/min   GFR, Est Non African American >89 >=60 mL/min  TSH  Result Value Ref Range   TSH 1.20 mIU/L  CBC with Differential/Platelet  Result Value Ref Range   WBC 7.2 3.8 - 10.8 K/uL   RBC 4.38 3.80 - 5.10 MIL/uL   Hemoglobin 14.0 11.7 - 15.5 g/dL   HCT 42.2 35.0 - 45.0 %   MCV 96.3 80.0 - 100.0 fL   MCH 32.0 27.0 - 33.0 pg   MCHC 33.2 32.0 - 36.0 g/dL   RDW 15.9 (H) 11.0 - 15.0 %   Platelets 249 140 - 400 K/uL   MPV 9.3 7.5 - 12.5 fL   Neutro Abs 4,608 1,500 - 7,800 cells/uL   Lymphs Abs 2,016 850 - 3,900 cells/uL   Monocytes Absolute 432 200 - 950 cells/uL   Eosinophils Absolute 144 15 - 500 cells/uL   Basophils Absolute 0 0 - 200 cells/uL   Neutrophils Relative % 64 %   Lymphocytes Relative 28 %   Monocytes Relative 6 %   Eosinophils Relative 2 %   Basophils Relative 0 %   Smear Review Criteria for review not met   Alkaline Phosphatase Isoenzymes  Result Value Ref Range   Alkaline Phonsphatase 122 33 - 130 U/L   Intestinal Isoenzymes 0 (L) 1 - 24 %   Bone Isoenzymes 38 28 - 66 %   Liver Isoenzymes 62 25 - 69 %      Assessment & Plan:   Problem List Items Addressed This Visit      Other   Vaginal mass    Lateral right wall near cervix but did not feel or appear contiguous; refer to GYN      Relevant Orders   Ambulatory referral to Gynecology   Screening for cervical cancer    Pap smear done today      Relevant Orders   Pap IG and HPV (high risk) DNA detection   Night sweats    Chest, abd, pelvis CT scan, along with pap smear, mammogram; full  work-up for malignancy      Relevant Orders   CT Chest W Contrast   CT Abdomen Pelvis W Contrast   Hx of abnormal cervical Pap smear    Around 1997, no records available; patient had some sort of "horrible treatment" and never went back for f/u, had not had pap smear since then until today; thin prep collected      Heavy tobacco smoker    Chest CT, as well as abdomen and pelvis, as smoking increases risk of several cancers; close f/u      Breast lump or mass    Bilateral, upper outer quadrant, close to areola at 9 o'clock on left breast; may simple be dense breast tissue noticeable now with weight loss, but will order diag mammo and bilateral US      Relevant Orders   MM Digital Diagnostic Bilat   US BREAST LTD UNI LEFT INC AXILLA   US BREAST LTD UNI RIGHT INC AXILLA   Alkaline phosphatase elevation    Previously higher, with stronger liver isoenzymes; patient still has not had liver US; will now be getting CT scan from chest to pelvis which will obviously include liver to look for malignancy      Abnormal weight loss  Patient has had further weight loss; will get CT chest, abd, pelvis; sent off pap smear, get mammogram for malignancy work-up; close f/u after tests to continue work-up or refer; normal TSH so not hyperthyroid      Relevant Orders   CT Chest W Contrast   CT Abdomen Pelvis W Contrast   Ambulatory referral to Gynecology      Follow up plan: Return 7-10 days, for follow-up.  An after-visit summary was printed and given to the patient at [place].  Please see the patient instructions which may contain other information and recommendations beyond what is mentioned above in the assessment and plan.  Face-to-face time with patient was more than 40 minutes, >50% time spent counseling and coordination of care  Orders Placed This Encounter  Procedures  . CT Chest W Contrast  . CT Abdomen Pelvis W Contrast  . MM Digital Diagnostic Bilat  . US BREAST LTD UNI LEFT  INC AXILLA  . US BREAST LTD UNI RIGHT INC AXILLA  . Ambulatory referral to Gynecology

## 2016-04-29 NOTE — Telephone Encounter (Signed)
Patient states needs new referral to pysch. States last one who she was referred to was not accepting new patients?

## 2016-04-29 NOTE — Assessment & Plan Note (Addendum)
Patient has had further weight loss; will get CT chest, abd, pelvis; sent off pap smear, get mammogram for malignancy work-up; close f/u after tests to continue work-up or refer; normal TSH so not hyperthyroid

## 2016-04-29 NOTE — Assessment & Plan Note (Signed)
Around 1997, no records available; patient had some sort of "horrible treatment" and never went back for f/u, had not had pap smear since then until today; thin prep collected

## 2016-04-29 NOTE — Assessment & Plan Note (Signed)
Chest CT, as well as abdomen and pelvis, as smoking increases risk of several cancers; close f/u

## 2016-04-29 NOTE — Assessment & Plan Note (Signed)
Refer to psych 

## 2016-04-29 NOTE — Assessment & Plan Note (Addendum)
Chest, abd, pelvis CT scan, along with pap smear, mammogram; full work-up for malignancy

## 2016-04-29 NOTE — Patient Instructions (Addendum)
I have put in referral for you to see the gynecologist I have ordered CT scans of your chest, abdomen, and pelvis I've ordered mammograms and ultrasounds of your breasts If you have not heard anything from my staff in a week about any orders/referrals/studies from today, please contact us here to follow-up [phone] Please return to see me in one week to ten days from now (after scans) to go over all the results

## 2016-04-29 NOTE — Assessment & Plan Note (Addendum)
Bilateral, upper outer quadrant, close to areola at 9 o'clock on left breast; may simple be dense breast tissue noticeable now with weight loss, but will order diag mammo and bilateral [place]

## 2016-04-29 NOTE — Telephone Encounter (Signed)
Referral entered; thank you 

## 2016-04-29 NOTE — Assessment & Plan Note (Signed)
Pap smear done today

## 2016-05-01 ENCOUNTER — Telehealth: Payer: Self-pay

## 2016-05-01 ENCOUNTER — Ambulatory Visit
Admission: RE | Admit: 2016-05-01 | Discharge: 2016-05-01 | Disposition: A | Discharge status: Home / Self Care | Payer: Medicare HMO | Source: Ambulatory Visit | Attending: Family Medicine | Admitting: Family Medicine

## 2016-05-01 DIAGNOSIS — R61 Generalized hyperhidrosis: Secondary | ICD-10-CM | POA: Insufficient documentation

## 2016-05-01 DIAGNOSIS — I700 Atherosclerosis of aorta: Secondary | ICD-10-CM | POA: Diagnosis not present

## 2016-05-01 DIAGNOSIS — K8020 Calculus of gallbladder without cholecystitis without obstruction: Secondary | ICD-10-CM | POA: Diagnosis not present

## 2016-05-01 DIAGNOSIS — R634 Abnormal weight loss: Secondary | ICD-10-CM | POA: Insufficient documentation

## 2016-05-01 DIAGNOSIS — I2510 Atherosclerotic heart disease of native coronary artery without angina pectoris: Secondary | ICD-10-CM | POA: Insufficient documentation

## 2016-05-01 DIAGNOSIS — E042 Nontoxic multinodular goiter: Secondary | ICD-10-CM | POA: Insufficient documentation

## 2016-05-01 DIAGNOSIS — J432 Centrilobular emphysema: Secondary | ICD-10-CM | POA: Insufficient documentation

## 2016-05-01 DIAGNOSIS — I708 Atherosclerosis of other arteries: Secondary | ICD-10-CM | POA: Diagnosis not present

## 2016-05-01 DIAGNOSIS — J984 Other disorders of lung: Secondary | ICD-10-CM | POA: Insufficient documentation

## 2016-05-01 LAB — PAP IG AND HPV HIGH-RISK: HPV DNA High Risk: NOT DETECTED

## 2016-05-01 MED ORDER — IOPAMIDOL (ISOVUE-300) INJECTION 61%
100.0000 mL | Freq: Once | INTRAVENOUS | Status: AC | PRN
  Administered 2016-05-01: 100 mL via INTRAVENOUS

## 2016-05-01 NOTE — Telephone Encounter (Signed)
Please review CT scans

## 2016-05-02 NOTE — Telephone Encounter (Signed)
I called patient Left message I'd like to review her scan with her in detail in person here on Monday, 11:40 am --------------------------------- Staff -- Please put patient down for an appointment Monday December 4th at 11:40 am Thank you

## 2016-05-05 ENCOUNTER — Ambulatory Visit: Payer: Self-pay | Admitting: Family Medicine

## 2016-05-07 ENCOUNTER — Ambulatory Visit (INDEPENDENT_AMBULATORY_CARE_PROVIDER_SITE_OTHER): Payer: Medicare HMO | Admitting: Family Medicine

## 2016-05-07 ENCOUNTER — Encounter: Payer: Self-pay | Admitting: Family Medicine

## 2016-05-07 DIAGNOSIS — K828 Other specified diseases of gallbladder: Secondary | ICD-10-CM | POA: Diagnosis not present

## 2016-05-07 DIAGNOSIS — T485X1A Poisoning by other anti-common-cold drugs, accidental (unintentional), initial encounter: Secondary | ICD-10-CM | POA: Diagnosis not present

## 2016-05-07 DIAGNOSIS — F3341 Major depressive disorder, recurrent, in partial remission: Secondary | ICD-10-CM

## 2016-05-07 DIAGNOSIS — F419 Anxiety disorder, unspecified: Secondary | ICD-10-CM

## 2016-05-07 DIAGNOSIS — T485X5A Adverse effect of other anti-common-cold drugs, initial encounter: Secondary | ICD-10-CM

## 2016-05-07 DIAGNOSIS — K639 Disease of intestine, unspecified: Secondary | ICD-10-CM | POA: Insufficient documentation

## 2016-05-07 DIAGNOSIS — F17200 Nicotine dependence, unspecified, uncomplicated: Secondary | ICD-10-CM

## 2016-05-07 DIAGNOSIS — R634 Abnormal weight loss: Secondary | ICD-10-CM | POA: Diagnosis not present

## 2016-05-07 DIAGNOSIS — F411 Generalized anxiety disorder: Secondary | ICD-10-CM | POA: Insufficient documentation

## 2016-05-07 DIAGNOSIS — J310 Chronic rhinitis: Secondary | ICD-10-CM

## 2016-05-07 DIAGNOSIS — F418 Other specified anxiety disorders: Secondary | ICD-10-CM

## 2016-05-07 HISTORY — DX: Other specified diseases of gallbladder: K82.8

## 2016-05-07 MED ORDER — FLUOXETINE HCL 40 MG PO CAPS: 40.0000 mg | ORAL_CAPSULE | Freq: Every day | ORAL | 2 refills | Status: DC

## 2016-05-07 MED ORDER — MIRTAZAPINE 15 MG PO TABS: 7.5000 mg | ORAL_TABLET | Freq: Every day | ORAL | 2 refills | Status: DC

## 2016-05-07 NOTE — Assessment & Plan Note (Addendum)
Urged smoking cessation but patient is not interested in quitting at this time

## 2016-05-07 NOTE — Patient Instructions (Addendum)
We'll have you see the surgeon right away I do encourage you to quit smoking Call [phone] to sign up for smoking cessation classes You can call 1-800-QUIT-NOW to talk with a smoking cessation coach Stop the sertraline Start the new medicine fluoxetine for depression and anxiety Start the new medicine mirtazipine at night to help with anxiety and appetite We'll refer you to a counselor Do work with [facility] grief counselors too which should help See medical attention right away or call 911 if any dark thoughts or thoughts of hurting yourself or someone else

## 2016-05-07 NOTE — Assessment & Plan Note (Addendum)
Reviewed the CT scan in its entirety with patient; discussed concern for possible malignancy; refer to gen surgeon

## 2016-05-07 NOTE — Assessment & Plan Note (Addendum)
Concerning for cancer; discussed findings on CT scan in detail with patient; refer to gen surg

## 2016-05-07 NOTE — Assessment & Plan Note (Signed)
--   Worrisome for malignancy.   °

## 2016-05-07 NOTE — Progress Notes (Signed)
BP 106/74   Pulse 98   Temp 98.2 F (36.8 C) (Oral)   Resp 14   Wt 113 lb (51.3 kg)   LMP  (Exact Date) Comment: 2008  SpO2 98%   BMI 18.24 kg/m    Subjective:    Patient ID: Kim Gallagher, female    DOB: Jan 08, 1966, 50 y.o.   MRN: [id number]  HPI: Kim Gallagher is a 50 y.o. female  Chief Complaint  Patient presents with  . Results    CT scan   Patient is here to go over the results of her CT scan No pain with eating; does have nausea sometimes, can be any time; mostly when waking in the morning Mother had to have gallbladder removed She had episode of weight loss and nausea years ago, 2010, 2013; they never found out what it was Wall thickening in proximal transverse colon No fam hx of colon cancer; no blood in the stool She has been taking sertraline 200 mg daily and not working well she says; she uses marijuana; I had to discontinue her benzo because of her drug use, unfortunately She is not interested in quitting cigarette smoking During the visit, she was noted to sniff repeatedly; she says she has been addicted to nose spray for years; she is not interested in seeing an ENT for this or stopping the nasal spray  ----------------------------------------------------------- CT scan chest/abd/pelvis May 01, 2016: CLINICAL DATA:  20 pound weight loss past month  EXAM: CT CHEST, ABDOMEN, AND PELVIS WITH CONTRAST  TECHNIQUE: Multidetector CT imaging of the chest, abdomen and pelvis was performed following the standard protocol during bolus administration of intravenous contrast.  CONTRAST:  ISOVUE-300 IOPAMIDOL (ISOVUE-300) INJECTION 61%  COMPARISON:  None.  FINDINGS: CT CHEST FINDINGS  Cardiovascular: There is no thoracic aortic aneurysm or dissection. There are foci of atherosclerotic calcification in the aorta. The visualized great vessels appear normal. There are scattered foci of coronary artery calcification. Pericardium is not  appreciably thickened. There is no major vessel pulmonary embolus.  Mediastinum/Nodes: There are small, subcentimeter nodular opacities in the thyroid consistent with multinodular goiter. There is no dominant thyroid mass. There is no appreciable thoracic adenopathy.  Lungs/Pleura: There is centrilobular emphysematous change. There are scattered bullae throughout the lungs bilaterally. There is no parenchymal lung mass or nodular lesion. There is no parenchymal lung edema or consolidation. There is no pleural effusion or thickening.  Musculoskeletal: There are no evident blastic or lytic bone lesions.  CT ABDOMEN PELVIS FINDINGS  Hepatobiliary: No focal liver lesions are evident. There is cholelithiasis with areas of calcification in the gallbladder wall. There is no pericholecystic fluid. There is no appreciable biliary duct dilatation.  Pancreas: There is no pancreatic mass or inflammatory focus appreciable.  Spleen: No splenic lesions are evident.  Adrenals/Urinary Tract: There is a right adrenal adenoma medially measuring 1.0 x 0.6 cm. Adrenals otherwise appear normal bilaterally. Kidneys bilaterally show no evident mass or hydronephrosis on either side. There is no renal or ureteral calculus on either side. Urinary bladder is decompressed.  Stomach/Bowel: There is wall thickening in the proximal transverse colon. There is concern for potential neoplasm within the proximal transverse colon given an abrupt contour change in this area compared to the areas proximal and distal to this region. This area of grade is concern for potential lesion in the transverse colon measures approximately 3 cm in length. There is no surrounding mesenteric thickening. No other evidence of bowel wall thickening. No bowel  obstruction is evident. There is no free air or portal venous air. Note that there are scattered sigmoid diverticula without diverticulitis.  Vascular/Lymphatic:  There is atherosclerotic calcification in the aorta and common iliac arteries. Foci of calcification are also noted in the proximal hypogastric arteries. Calcification in the proximal common iliac arteries is extensive with what is felt to be hemodynamically significant obstruction in these areas, particularly on the left. No adenopathy is evident in the abdomen or pelvis.  Reproductive: The uterus is anteverted. No pelvic mass or pelvic fluid collection is evident. Cervix is mildly prominent by CT.  Other: Appendix is not appreciable. No periappendiceal region inflammation evident. No ascites or abscess evident in the abdomen or pelvis.  Musculoskeletal: There are no blastic or lytic bone lesions. Bones appear somewhat osteoporotic. There is no intramuscular or abdominal wall lesion.  IMPRESSION: Chest CT: No parenchymal lung mass or adenopathy. Underlying centrilobular emphysema with multiple bullous lesions in the lungs. No pleural effusions. Foci of atherosclerotic calcification in the aorta as well as foci of coronary artery calcification. Multinodular goiter without dominant lesion.  CT abdomen and pelvis: There is an area of narrowing in the proximal transverse colon consistent for potential neoplasm. Direct visualization of this area advised.  Mild prominence of the cervix. Advise appropriate clinical evaluation and Pap smear if these examinations have not been performed recently.  Evidence of porcelain gallbladder with calcification gallbladder wall. There is also cholelithiasis. It should be noted that there is an increased incidence of gallbladder carcinoma associated with porcelain gallbladder.  There is aortoiliac atherosclerosis.  No adenopathy.  No liver lesions evident.  No bowel obstruction. No abscess. No periappendiceal region inflammation. No renal or ureteral calculi. No hydronephrosis.  These results will be called to the ordering  clinician or representative by the Radiologist Assistant, and communication documented in the PACS or zVision Dashboard.   Electronically Signed   By: Bretta Bang III M.D.   On: 05/01/2016 14:13 ----------------------------------------------------------   Depression screen [facility] 2/9 04/29/2016 03/18/2016 11/02/2015 04/05/2015  Decreased Interest 1 0 0 0  Down, Depressed, Hopeless 3 0 0 0  PHQ - 2 Score 4 0 0 0  Altered sleeping 3 - - -  Tired, decreased energy 3 - - -  Change in appetite 3 - - -  Feeling bad or failure about yourself  3 - - -  Trouble concentrating 3 - - -  Moving slowly or fidgety/restless 0 - - -  Suicidal thoughts 0 - - -  PHQ-9 Score 19 - - -  Difficult doing work/chores Somewhat difficult - - -   Relevant past medical, surgical, family and social history reviewed Past Medical History:  Diagnosis Date  . Anxiety   . Chronic major depressive disorder   . Dysuria   . Heavy smoker   . History of TIA (transient ischemic attack)   . Hyperlipidemia, acquired   . Seizures (HCC)   . Urinary incontinence in female    Past Surgical History:  Procedure Laterality Date  . TUBAL LIGATION     Family History  Problem Relation Age of Onset  . Kidney disease Mother   . Hypertension Mother   . Depression Mother   . Diabetes Mother   . Stroke Father   . Hypertension Father   . Cancer Father     thyroid  . Diabetes Father   . Diabetes Sister   . Diabetes Brother    Social History  Substance Use Topics  . Smoking  status: Current Every Day Smoker    Packs/day: 1.00    Types: Cigarettes  . Smokeless tobacco: Never Used  . Alcohol use No  MD note: patient is not interested in quitting smoking  Interim medical history since last visit reviewed. Allergies and medications reviewed  Review of Systems  Constitutional: Positive for unexpected weight change (significant weight loss over last month).  Gastrointestinal: Positive for nausea.   Psychiatric/Behavioral:       Anxious about getting results of her scan today   Per HPI unless specifically indicated above     Objective:    BP 106/74   Pulse 98   Temp 98.2 F (36.8 C) (Oral)   Resp 14   Wt 113 lb (51.3 kg)   LMP  (Exact Date) Comment: 2008  SpO2 98%   BMI 18.24 kg/m   Wt Readings from Last 3 Encounters:  05/09/16 112 lb (50.8 kg)  05/07/16 113 lb (51.3 kg)  04/29/16 112 lb (50.8 kg)    Physical Exam  Constitutional: She appears well-developed and well-nourished. No distress.  Thin chronically ill appearing female  HENT:  Nose: No epistaxis.  Repeated sniffing, very frequent  Eyes: EOM are normal. No scleral icterus.  Neck: No thyromegaly present.  Cardiovascular: Normal rate.   Pulmonary/Chest: Effort normal.  Abdominal: She exhibits no distension.  Musculoskeletal: She exhibits no edema.  Skin: No pallor.  Psychiatric: Her behavior is normal. Judgment and thought content normal. Her mood appears anxious. Cognition and memory are not impaired. She does not exhibit a depressed mood.    Results for orders placed or performed in visit on 04/29/16  Pap IG and HPV (high risk) DNA detection  Result Value Ref Range   HPV DNA High Risk Not Detected    Specimen adequacy:     FINAL DIAGNOSIS:     COMMENTS:     Cytotechnologist:     QC Cytotechnologist:        Assessment & Plan:   Problem List Items Addressed This Visit      Respiratory   Rhinitis medicamentosa    Patient reports addiction to nasal spray for years; she is not interested in seeing ENT at this time or stopping it for that matter        Digestive   Porcelain gallbladder    Reviewed the CT scan in its entirety with patient; discussed concern for possible malignancy; refer to gen surgeon      Relevant Orders   Ambulatory referral to General Surgery   Colonic thickening    Concerning for cancer; discussed findings on CT scan in detail with patient; refer to gen surg       Relevant Orders   Ambulatory referral to General Surgery     Other   Major depressive disorder, recurrent episode, in partial remission with anxious distress (HCC)    I do not plan on renewing her benzodiazepine with her drug use; will switch SSRI to fluoxetine and add mirtazipine and refer to counselor/therapist for CBT      Relevant Medications   mirtazapine (REMERON) 15 MG tablet   FLUoxetine (PROZAC) 40 MG capsule   Heavy tobacco smoker    Urged smoking cessation but patient is not interested in quitting at this time      Anxiety    Refer to psychologist for help; switch SSRI, add mirtazipine; seek help if any dark thoughts      Relevant Medications   mirtazapine (REMERON) 15 MG tablet   FLUoxetine (  PROZAC) 40 MG capsule   Other Relevant Orders   Ambulatory referral to Psychology   Abnormal weight loss    Worrisome for malignancy      Relevant Orders   Ambulatory referral to General Surgery      Follow up plan: Return in about 3 weeks (around 05/28/2016) for weight check and medicine assessment with Dr. Sherie DonLada.  An after-visit summary was printed and given to the patient at check-out.  Please see the patient instructions which may contain other information and recommendations beyond what is mentioned above in the assessment and plan.  Meds ordered this encounter  Medications  . mirtazapine (REMERON) 15 MG tablet    Sig: Take 0.5 tablets (7.5 mg total) by mouth at bedtime. For sleep and to help with weight gain    Dispense:  15 tablet    Refill:  2  . FLUoxetine (PROZAC) 40 MG capsule    Sig: Take 1 capsule (40 mg total) by mouth daily. (this replaces sertraline)    Dispense:  30 capsule    Refill:  2    (STOP sertraline)    Orders Placed This Encounter  Procedures  . Ambulatory referral to General Surgery  . Ambulatory referral to Psychology

## 2016-05-07 NOTE — Assessment & Plan Note (Signed)
Refer to psychologist for help; switch SSRI, add mirtazipine; seek help if any dark thoughts

## 2016-05-09 ENCOUNTER — Encounter: Payer: Self-pay | Admitting: General Surgery

## 2016-05-09 ENCOUNTER — Ambulatory Visit (INDEPENDENT_AMBULATORY_CARE_PROVIDER_SITE_OTHER): Payer: Medicare HMO | Admitting: General Surgery

## 2016-05-09 VITALS — BP 110/68 | HR 66 | Resp 12 | Ht 65.0 in | Wt 112.0 lb

## 2016-05-09 DIAGNOSIS — Z1211 Encounter for screening for malignant neoplasm of colon: Secondary | ICD-10-CM

## 2016-05-09 DIAGNOSIS — R933 Abnormal findings on diagnostic imaging of other parts of digestive tract: Secondary | ICD-10-CM

## 2016-05-09 DIAGNOSIS — K828 Other specified diseases of gallbladder: Secondary | ICD-10-CM

## 2016-05-09 DIAGNOSIS — R634 Abnormal weight loss: Secondary | ICD-10-CM | POA: Diagnosis not present

## 2016-05-09 MED ORDER — POLYETHYLENE GLYCOL 3350 17 GM/SCOOP PO POWD: 1.0000 | Freq: Once | ORAL | 0 refills | Status: AC

## 2016-05-09 NOTE — Progress Notes (Signed)
Patient ID: Kim Gallagher, female   DOB: Jul 31, 1965, 50 y.o.   MRN: [id number]  Chief Complaint  Patient presents with  . Other    weight loss    HPI Kim CorneaLisa C Koslow is a 50 y.o. female here today for evaluation of weight loss. No pain with eating and has some nausea when she wakes up in the morning .Patient states she has lost at least 20 pound in the last month. She moves her bowels daily but always diarrhea. Ct scan done on 05/01/2016.  I have reviewed the history of present illness with the patient.  HPI  Past Medical History:  Diagnosis Date  . Anxiety   . Chronic major depressive disorder   . Dysuria   . Heavy smoker   . History of TIA (transient ischemic attack)   . Hyperlipidemia, acquired   . Seizures (HCC)   . Urinary incontinence in female     Past Surgical History:  Procedure Laterality Date  . TUBAL LIGATION      Family History  Problem Relation Age of Onset  . Kidney disease Mother   . Hypertension Mother   . Depression Mother   . Diabetes Mother   . Stroke Father   . Hypertension Father   . Cancer Father     thyroid  . Diabetes Father   . Diabetes Sister   . Diabetes Brother     Social History Social History  Substance Use Topics  . Smoking status: Current Every Day Smoker    Packs/day: 1.00    Types: Cigarettes  . Smokeless tobacco: Never Used  . Alcohol use No    Allergies  Allergen Reactions  . Sulfa Antibiotics Rash    Current Outpatient Prescriptions  Medication Sig Dispense Refill  . aspirin 325 MG tablet Take 325 mg by mouth daily.    Marland Kitchen. FLUoxetine (PROZAC) 40 MG capsule Take 1 capsule (40 mg total) by mouth daily. (this replaces sertraline) 30 capsule 2  . mirtazapine (REMERON) 15 MG tablet Take 0.5 tablets (7.5 mg total) by mouth at bedtime. For sleep and to help with weight gain 15 tablet 2  . phenytoin (DILANTIN) 100 MG ER capsule Take 300 mg by mouth at bedtime.    . polyethylene glycol powder (GLYCOLAX/MIRALAX) powder Take 255 g  by mouth once. 255 g 0   No current facility-administered medications for this visit.     Review of Systems Review of Systems  Constitutional: Positive for appetite change, fatigue and unexpected weight change.  Respiratory: Negative.   Cardiovascular: Negative.   Gastrointestinal: Positive for diarrhea.    Blood pressure 110/68, pulse 66, resp. rate 12, height 5\' 5"  (1.651 m), weight 112 lb (50.8 kg).  Physical Exam Physical Exam  Constitutional: She is oriented to person, place, and time. She appears well-developed and well-nourished.  Eyes: Conjunctivae are normal. No scleral icterus.  Neck: Neck supple.  Cardiovascular: Normal rate, regular rhythm and normal heart sounds.   Pulmonary/Chest: Effort normal and breath sounds normal.  Abdominal: Soft. Bowel sounds are normal. She exhibits no mass. There is no hepatosplenomegaly. There is no tenderness. No hernia.  Lymphadenopathy:    She has no cervical adenopathy.    She has no axillary adenopathy.  Neurological: She is alert and oriented to person, place, and time.  Skin: Skin is warm and dry.    Data Reviewed Notes and ct scan reviewed  Basic labs 1 mo ago were normal. CT- suspicious for a mass in proximal transverse  colon. Calcified gallbladder wall. Assessment    1. Wight loss, unexplained. 2. CT suggesting a possible transverse colon lesion and calcified gallbladder wall. 3. Smoking history    Plan   Pt advised on CT findings. Need for colonoscopy explained. Risk of cancer associated with gallbladder wall calcification also explained. Pt agreeable to colonoscopy    Colonoscopy with possible biopsy/polypectomy prn: Information regarding the procedure, including its potential risks and complications (including but not limited to perforation of the bowel, which may require emergency surgery to repair, and bleeding) was verbally given to the patient. Educational information regarding lower intestinal endoscopy was given  to the patient. Written instructions for how to complete the bowel prep using Miralax were provided. The importance of drinking ample fluids to avoid dehydration as a result of the prep emphasized.  Discussed quit smoking with patient she states she will not quit.  The patient is scheduled for [facility]. They are aware to call the day before to get their arrival time. She will decrease her Aspirin to 81mg  daily starting tomorrow.  Miralax prescription has been sent into the patient's pharmacy. The patient is aware of date and instructions.    This information has been scribed by Ples SpecterJessica Qualls CMA.'  Kieth BrightlySANKAR,Jhett Fretwell G 05/09/2016, 10:45 AM

## 2016-05-09 NOTE — Patient Instructions (Addendum)
Colonoscopy, Adult A colonoscopy is an exam to look at the entire large intestine. During the exam, a lubricated, bendable tube is inserted into the anus and then passed into the rectum, colon, and other parts of the large intestine. A colonoscopy is often done as a part of normal colorectal screening or in response to certain symptoms, such as anemia, persistent diarrhea, abdominal pain, and blood in the stool. The exam can help screen for and diagnose medical problems, including:  Tumors.  Polyps.  Inflammation.  Areas of bleeding. Tell a health care provider about:  Any allergies you have.  All medicines you are taking, including vitamins, herbs, eye drops, creams, and over-the-counter medicines.  Any problems you or family members have had with anesthetic medicines.  Any blood disorders you have.  Any surgeries you have had.  Any medical conditions you have.  Any problems you have had passing stool. What are the risks? Generally, this is a safe procedure. However, problems may occur, including:  Bleeding.  A tear in the intestine.  A reaction to medicines given during the exam.  Infection (rare). What happens before the procedure? Eating and drinking restrictions  Follow instructions from your health care provider about eating and drinking, which may include:  A few days before the procedure - follow a low-fiber diet. Avoid nuts, seeds, dried fruit, raw fruits, and vegetables.  1-3 days before the procedure - follow a clear liquid diet. Drink only clear liquids, such as clear broth or bouillon, black coffee or tea, clear juice, clear soft drinks or sports drinks, gelatin desert, and popsicles. Avoid any liquids that contain red or purple dye.  On the day of the procedure - do not eat or drink anything during the 2 hours before the procedure, or within the time period that your health care provider recommends. Bowel prep  If you were prescribed an oral bowel prep to  clean out your colon:  Take it as told by your health care provider. Starting the day before your procedure, you will need to drink a large amount of medicated liquid. The liquid will cause you to have multiple loose stools until your stool is almost clear or light green.  If your skin or anus gets irritated from diarrhea, you may use these to relieve the irritation:  Medicated wipes, such as adult wet wipes with aloe and vitamin E.  A skin soothing-product like petroleum jelly.  If you vomit while drinking the bowel prep, take a break for up to 60 minutes and then begin the bowel prep again. If vomiting continues and you cannot take the bowel prep without vomiting, call your health care provider. General instructions  Ask your health care provider about changing or stopping your regular medicines. This is especially important if you are taking diabetes medicines or blood thinners.  Plan to have someone take you home from the hospital or clinic. What happens during the procedure?  An IV tube may be inserted into one of your veins.  You will be given medicine to help you relax (sedative).  To reduce your risk of infection:  Your health care team will wash or sanitize their hands.  Your anal area will be washed with soap.  You will be asked to lie on your side with your knees bent.  Your health care provider will lubricate a long, thin, flexible tube. The tube will have a camera and a light on the end.  The tube will be inserted into your anus.  The tube will be gently eased through your rectum and colon.  Air will be delivered into your colon to keep it open. You may feel some pressure or cramping.  The camera will be used to take images during the procedure.  A small tissue sample may be removed from your body to be examined under a microscope (biopsy). If any potential problems are found, the tissue will be sent to a lab for testing.  If small polyps are found, your health  care provider may remove them and have them checked for cancer cells.  The tube that was inserted into your anus will be slowly removed. The procedure may vary among health care providers and hospitals. What happens after the procedure?  Your blood pressure, heart rate, breathing rate, and blood oxygen level will be monitored until the medicines you were given have worn off.  Do not drive for 24 hours after the exam.  You may have a small amount of blood in your stool.  You may pass gas and have mild abdominal cramping or bloating due to the air that was used to inflate your colon during the exam.  It is up to you to get the results of your procedure. Ask your health care provider, or the department performing the procedure, when your results will be ready. This information is not intended to replace advice given to you by your health care provider. Make sure you discuss any questions you have with your health care provider. Document Released: 05/16/2000 Document Revised: 12/07/2015 Document Reviewed: 07/31/2015 Elsevier Interactive Patient Education  2017 [employer]  The patient is [facility]. They are aware to call the day before to get their arrival time. She will decrease her Aspirin to 81mg  daily starting tomorrow.  Miralax prescription has been sent into the patient's pharmacy. The patient is aware of date and instructions.

## 2016-05-11 DIAGNOSIS — J310 Chronic rhinitis: Secondary | ICD-10-CM | POA: Insufficient documentation

## 2016-05-11 DIAGNOSIS — T485X5A Adverse effect of other anti-common-cold drugs, initial encounter: Secondary | ICD-10-CM

## 2016-05-11 NOTE — Assessment & Plan Note (Signed)
Patient reports addiction to nasal spray for years; she is not interested in seeing ENT at this time or stopping it for that matter

## 2016-05-11 NOTE — Assessment & Plan Note (Signed)
I do not plan on renewing her benzodiazepine with her drug use; will switch SSRI to fluoxetine and add mirtazipine and refer to counselor/therapist for CBT

## 2016-05-12 ENCOUNTER — Encounter: Payer: Self-pay | Admitting: *Deleted

## 2016-05-13 ENCOUNTER — Encounter: Payer: Self-pay | Admitting: *Deleted

## 2016-05-13 ENCOUNTER — Ambulatory Visit: Payer: Medicare HMO | Admitting: Anesthesiology

## 2016-05-13 ENCOUNTER — Telehealth: Payer: Self-pay

## 2016-05-13 ENCOUNTER — Other Ambulatory Visit: Payer: Self-pay

## 2016-05-13 ENCOUNTER — Encounter
Admission: RE | Disposition: A | Discharge status: Home / Self Care | Payer: Self-pay | Source: Ambulatory Visit | Attending: General Surgery

## 2016-05-13 ENCOUNTER — Ambulatory Visit
Admission: RE | Admit: 2016-05-13 | Discharge: 2016-05-13 | Disposition: A | Discharge status: Home / Self Care | Payer: Medicare HMO | Source: Ambulatory Visit | Attending: General Surgery | Admitting: General Surgery

## 2016-05-13 DIAGNOSIS — R634 Abnormal weight loss: Secondary | ICD-10-CM | POA: Diagnosis not present

## 2016-05-13 DIAGNOSIS — Z7982 Long term (current) use of aspirin: Secondary | ICD-10-CM | POA: Diagnosis not present

## 2016-05-13 DIAGNOSIS — Z79899 Other long term (current) drug therapy: Secondary | ICD-10-CM | POA: Insufficient documentation

## 2016-05-13 DIAGNOSIS — R197 Diarrhea, unspecified: Secondary | ICD-10-CM | POA: Insufficient documentation

## 2016-05-13 DIAGNOSIS — Z8673 Personal history of transient ischemic attack (TIA), and cerebral infarction without residual deficits: Secondary | ICD-10-CM | POA: Diagnosis not present

## 2016-05-13 DIAGNOSIS — F17210 Nicotine dependence, cigarettes, uncomplicated: Secondary | ICD-10-CM | POA: Diagnosis not present

## 2016-05-13 DIAGNOSIS — Z681 Body mass index (BMI) 19 or less, adult: Secondary | ICD-10-CM | POA: Insufficient documentation

## 2016-05-13 DIAGNOSIS — F329 Major depressive disorder, single episode, unspecified: Secondary | ICD-10-CM | POA: Diagnosis not present

## 2016-05-13 DIAGNOSIS — R933 Abnormal findings on diagnostic imaging of other parts of digestive tract: Secondary | ICD-10-CM | POA: Insufficient documentation

## 2016-05-13 HISTORY — DX: Cerebral infarction, unspecified: I63.9

## 2016-05-13 HISTORY — PX: COLONOSCOPY WITH PROPOFOL: SHX5780

## 2016-05-13 SURGERY — COLONOSCOPY WITH PROPOFOL
Anesthesia: General

## 2016-05-13 MED ORDER — PROPOFOL 500 MG/50ML IV EMUL
INTRAVENOUS | Status: DC | PRN
  Administered 2016-05-13: 150 ug/kg/min via INTRAVENOUS

## 2016-05-13 MED ORDER — SODIUM CHLORIDE 0.9 % IV SOLN
INTRAVENOUS | Status: DC | PRN
  Administered 2016-05-13: 14:00:00 via INTRAVENOUS

## 2016-05-13 MED ORDER — PROPOFOL 10 MG/ML IV BOLUS
INTRAVENOUS | Status: DC | PRN
  Administered 2016-05-13: 60 mg via INTRAVENOUS

## 2016-05-13 MED ORDER — SODIUM CHLORIDE 0.9 % IV SOLN
Freq: Once | INTRAVENOUS | Status: AC
  Administered 2016-05-13: 14:00:00 via INTRAVENOUS

## 2016-05-13 MED ORDER — LIDOCAINE HCL (CARDIAC) 20 MG/ML IV SOLN
INTRAVENOUS | Status: DC | PRN
  Administered 2016-05-13: 60 mg via INTRAVENOUS

## 2016-05-13 MED ORDER — MIDAZOLAM HCL 2 MG/2ML IJ SOLN
INTRAMUSCULAR | Status: DC | PRN
  Administered 2016-05-13: 2 mg via INTRAVENOUS

## 2016-05-13 NOTE — Telephone Encounter (Signed)
Spoke with patient about redoing her colonoscopy due to inadequate bowel prep. Dr Evette CristalSankar had wanted to complete this on 05/15/16, but the patient has her sister's funeral to attend. [facility] is [facility]. She will use the Go-Lytely prep with Bisacodyl tablets, this prescription has been called into her pharmacy.  She will continue with her 81 mg Aspirin. She is aware to call the day before for her arrival time. The patient is aware of date and instructions.

## 2016-05-13 NOTE — Op Note (Signed)
[facility] Patient Name: Kim LampLisa Nucci Procedure Date: 05/13/2016 2:21 PM MRN: [id number] Account #: [id number] Date of Birth: 31-Jan-1966 Admit Type: Outpatient Age: [age] [facility] ROOM 4 Gender: Female Note Status: Finalized Procedure:            Colonoscopy Indications:          Abnormal CT of the GI tract, Weight loss Providers:            Razi Hickle G. Evette CristalSankar, MD Referring MD:         Kerman PasseyMelinda P. Lada (Referring MD) Medicines:            General Anesthesia Complications:        No immediate complications. Procedure:            Pre-Anesthesia Assessment:                       - General anesthesia under the supervision of an                        anesthesiologist was determined to be medically                        necessary for this procedure based on review of the                        patient's medical history, medications, and prior                        anesthesia history.                       After obtaining informed consent, the colonoscope was                        passed under direct vision. Throughout the procedure,                        the patient's blood pressure, pulse, and oxygen                        saturations were monitored continuously. The                        Colonoscope was introduced through the anus with the                        intention of advancing to the cecum. The scope was                        advanced to the descending colon before the procedure                        was aborted. Medications were not given. The                        colonoscopy was extremely difficult due to poor bowel                        prep with stool present. The patient tolerated the  procedure well. Findings:      The perianal and digital rectal examinations were normal.      A large amount of stool was found in the recto-sigmoid up to descending       colon, precluding  visualization. Impression:           - Stool in the recto-sigmoid colon.                       - No specimens collected. Recommendation:       - Repeat colonoscopy in 1 day because the examination                        was incomplete.                       - Discharge patient to home.                       - Clear liquid diet for 1 day. Procedure Code(s):    --- Professional ---                       [id number], 53, Colonoscopy, flexible; diagnostic, including                        collection of specimen(s) by brushing or washing, when                        performed (separate procedure) Diagnosis Code(s):    --- Professional ---                       R63.4, Abnormal weight loss                       R93.3, Abnormal findings on diagnostic imaging of other                        parts of digestive tract CPT copyright 2016 [employer]. All rights reserved. The codes documented in this report are preliminary and upon coder review may  be revised to meet current compliance requirements. Kieth BrightlySeeplaputhur G Barkley Kratochvil, MD 05/13/2016 2:42:51 PM This report has been signed electronically. Number of Addenda: 0 Note Initiated On: 05/13/2016 2:21 PM Total Procedure Duration: 0 hours 11 minutes 48 seconds       [facility]

## 2016-05-13 NOTE — H&P (View-Only) (Signed)
Patient ID: Kim Gallagher, female   DOB: Jul 31, 1965, 50 y.o.   MRN: [id number]  Chief Complaint  Patient presents with  . Other    weight loss    HPI Kim CorneaLisa C Koslow is a 50 y.o. female here today for evaluation of weight loss. No pain with eating and has some nausea when she wakes up in the morning .Patient states she has lost at least 20 pound in the last month. She moves her bowels daily but always diarrhea. Ct scan done on 05/01/2016.  I have reviewed the history of present illness with the patient.  HPI  Past Medical History:  Diagnosis Date  . Anxiety   . Chronic major depressive disorder   . Dysuria   . Heavy smoker   . History of TIA (transient ischemic attack)   . Hyperlipidemia, acquired   . Seizures (HCC)   . Urinary incontinence in female     Past Surgical History:  Procedure Laterality Date  . TUBAL LIGATION      Family History  Problem Relation Age of Onset  . Kidney disease Mother   . Hypertension Mother   . Depression Mother   . Diabetes Mother   . Stroke Father   . Hypertension Father   . Cancer Father     thyroid  . Diabetes Father   . Diabetes Sister   . Diabetes Brother     Social History Social History  Substance Use Topics  . Smoking status: Current Every Day Smoker    Packs/day: 1.00    Types: Cigarettes  . Smokeless tobacco: Never Used  . Alcohol use No    Allergies  Allergen Reactions  . Sulfa Antibiotics Rash    Current Outpatient Prescriptions  Medication Sig Dispense Refill  . aspirin 325 MG tablet Take 325 mg by mouth daily.    Marland Kitchen. FLUoxetine (PROZAC) 40 MG capsule Take 1 capsule (40 mg total) by mouth daily. (this replaces sertraline) 30 capsule 2  . mirtazapine (REMERON) 15 MG tablet Take 0.5 tablets (7.5 mg total) by mouth at bedtime. For sleep and to help with weight gain 15 tablet 2  . phenytoin (DILANTIN) 100 MG ER capsule Take 300 mg by mouth at bedtime.    . polyethylene glycol powder (GLYCOLAX/MIRALAX) powder Take 255 g  by mouth once. 255 g 0   No current facility-administered medications for this visit.     Review of Systems Review of Systems  Constitutional: Positive for appetite change, fatigue and unexpected weight change.  Respiratory: Negative.   Cardiovascular: Negative.   Gastrointestinal: Positive for diarrhea.    Blood pressure 110/68, pulse 66, resp. rate 12, height 5\' 5"  (1.651 m), weight 112 lb (50.8 kg).  Physical Exam Physical Exam  Constitutional: She is oriented to person, place, and time. She appears well-developed and well-nourished.  Eyes: Conjunctivae are normal. No scleral icterus.  Neck: Neck supple.  Cardiovascular: Normal rate, regular rhythm and normal heart sounds.   Pulmonary/Chest: Effort normal and breath sounds normal.  Abdominal: Soft. Bowel sounds are normal. She exhibits no mass. There is no hepatosplenomegaly. There is no tenderness. No hernia.  Lymphadenopathy:    She has no cervical adenopathy.    She has no axillary adenopathy.  Neurological: She is alert and oriented to person, place, and time.  Skin: Skin is warm and dry.    Data Reviewed Notes and ct scan reviewed  Basic labs 1 mo ago were normal. CT- suspicious for a mass in proximal transverse  colon. Calcified gallbladder wall. Assessment    1. Wight loss, unexplained. 2. CT suggesting a possible transverse colon lesion and calcified gallbladder wall. 3. Smoking history    Plan   Pt advised on CT findings. Need for colonoscopy explained. Risk of cancer associated with gallbladder wall calcification also explained. Pt agreeable to colonoscopy    Colonoscopy with possible biopsy/polypectomy prn: Information regarding the procedure, including its potential risks and complications (including but not limited to perforation of the bowel, which may require emergency surgery to repair, and bleeding) was verbally given to the patient. Educational information regarding lower intestinal endoscopy was given  to the patient. Written instructions for how to complete the bowel prep using Miralax were provided. The importance of drinking ample fluids to avoid dehydration as a result of the prep emphasized.  Discussed quit smoking with patient she states she will not quit.  The patient is scheduled for a Colonoscopy at [facility] on 05/13/16. They are aware to call the day before to get their arrival time. She will decrease her Aspirin to 81mg daily starting tomorrow.  Miralax prescription has been sent into the patient's pharmacy. The patient is aware of date and instructions.    This information has been scribed by Jessica Qualls CMA.'  SANKAR,SEEPLAPUTHUR G 05/09/2016, 10:45 AM   

## 2016-05-13 NOTE — Anesthesia Preprocedure Evaluation (Signed)
Anesthesia Evaluation  Patient identified by MRN, date of birth, ID band Patient awake    Reviewed: Allergy & Precautions, H&P , NPO status , Patient's Chart, lab work & pertinent test results, reviewed documented beta blocker date and time   Airway Mallampati: II   Neck ROM: full    Dental  (+) Poor Dentition   Pulmonary neg pulmonary ROS, Current Smoker,    Pulmonary exam normal        Cardiovascular negative cardio ROS Normal cardiovascular exam Rhythm:regular Rate:Normal     Neuro/Psych Seizures -, Well Controlled,  PSYCHIATRIC DISORDERS CVA negative neurological ROS  negative psych ROS   GI/Hepatic negative GI ROS, Neg liver ROS,   Endo/Other  negative endocrine ROS  Renal/GU negative Renal ROS  negative genitourinary   Musculoskeletal   Abdominal   Peds  Hematology negative hematology ROS (+)   Anesthesia Other Findings Past Medical History: No date: Anxiety No date: Chronic major depressive disorder No date: Dysuria No date: Heavy smoker No date: History of TIA (transient ischemic attack) No date: Hyperlipidemia, acquired No date: Seizures (HCC) No date: Urinary incontinence in female Past Surgical History: No date: TUBAL LIGATION   Reproductive/Obstetrics negative OB ROS                             Anesthesia Physical Anesthesia Plan  ASA: III  Anesthesia Plan: General   Post-op Pain Management:    Induction:   Airway Management Planned:   Additional Equipment:   Intra-op Plan:   Post-operative Plan:   Informed Consent: I have reviewed the patients History and Physical, chart, labs and discussed the procedure including the risks, benefits and alternatives for the proposed anesthesia with the patient or authorized representative who has indicated his/her understanding and acceptance.   Dental Advisory Given  Plan Discussed with: CRNA  Anesthesia Plan  Comments:         Anesthesia Quick Evaluation

## 2016-05-13 NOTE — Transfer of Care (Signed)
Immediate Anesthesia Transfer of Care Note  Patient: Kim CorneaLisa C Gallagher  Procedure(s) Performed: Procedure(s): COLONOSCOPY WITH PROPOFOL (N/A)  Patient Location: PACU  Anesthesia Type:General  Level of Consciousness: sedated and responds to stimulation  Airway & Oxygen Therapy: Patient Spontanous Breathing and Patient connected to nasal cannula oxygen  Post-op Assessment: Report given to RN and Post -op Vital signs reviewed and stable  Post vital signs: Reviewed and stable  Last Vitals:  Vitals:   05/13/16 1352  BP: 121/73  Pulse: (!) 111  Resp: 18  Temp: (!) 36 C    Last Pain:  Vitals:   05/13/16 1352  TempSrc: Tympanic      Patients Stated Pain Goal: 0 (05/13/16 1352)  Complications: No apparent anesthesia complications

## 2016-05-13 NOTE — Interval H&P Note (Signed)
History and Physical Interval Note:  05/13/2016 1:54 PM  Kim Gallagher  has presented today for surgery, with the diagnosis of ABN IMAGING WEIGHT LOSS  The various methods of treatment have been discussed with the patient and family. After consideration of risks, benefits and other options for treatment, the patient has consented to  Procedure(s): COLONOSCOPY WITH PROPOFOL (N/A) as a surgical intervention .  The patient's history has been reviewed, patient examined, no change in status, stable for surgery.  I have reviewed the patient's chart and labs.  Questions were answered to the patient's satisfaction.     Ediel Unangst G

## 2016-05-13 NOTE — Anesthesia Procedure Notes (Signed)
Performed by: Linton Stolp Pre-anesthesia Checklist: Patient identified, Emergency Drugs available, Suction available, Patient being monitored and Timeout performed Patient Re-evaluated:Patient Re-evaluated prior to inductionOxygen Delivery Method: Nasal cannula Preoxygenation: Pre-oxygenation with 100% oxygen Intubation Type: IV induction       

## 2016-05-14 ENCOUNTER — Telehealth: Payer: Self-pay | Admitting: *Deleted

## 2016-05-14 ENCOUNTER — Other Ambulatory Visit: Payer: Self-pay | Admitting: General Surgery

## 2016-05-14 NOTE — Telephone Encounter (Signed)
Message left on cell phone for patient to call the office.   We need to inform patient she will need to complete a 2 day liquid prep prior to upcoming colon[facility].

## 2016-05-15 ENCOUNTER — Encounter: Payer: Self-pay | Admitting: General Surgery

## 2016-05-15 NOTE — Telephone Encounter (Signed)
Patient was contacted this afternoon and notified that she needs to do a liquid diet 2 days prior to colonoscopy. She verbalizes understanding.

## 2016-05-15 NOTE — Anesthesia Postprocedure Evaluation (Signed)
Anesthesia Post Note  Patient: Kim Gallagher  Procedure(s) Performed: Procedure(s) (LRB): COLONOSCOPY WITH PROPOFOL (N/A)  Patient location during evaluation: PACU Anesthesia Type: General Level of consciousness: awake and alert Pain management: pain level controlled Vital Signs Assessment: post-procedure vital signs reviewed and stable Respiratory status: spontaneous breathing, nonlabored ventilation, respiratory function stable and patient connected to nasal cannula oxygen Cardiovascular status: blood pressure returned to baseline and stable Postop Assessment: no signs of nausea or vomiting Anesthetic complications: no    Last Vitals:  Vitals:   05/13/16 1501 05/13/16 1511  BP: 120/69 131/65  Pulse: 81 83  Resp: 18 20  Temp:      Last Pain:  Vitals:   05/13/16 1441  TempSrc: Tympanic                 Yevette EdwardsJames G Adams

## 2016-05-15 NOTE — Telephone Encounter (Signed)
Another message left on cell phone for patient to call the office.

## 2016-05-21 ENCOUNTER — Ambulatory Visit: Payer: Medicare HMO | Admitting: Anesthesiology

## 2016-05-21 ENCOUNTER — Ambulatory Visit
Admission: RE | Admit: 2016-05-21 | Discharge: 2016-05-21 | Disposition: A | Discharge status: Home / Self Care | Payer: Medicare HMO | Source: Ambulatory Visit | Attending: General Surgery | Admitting: General Surgery

## 2016-05-21 ENCOUNTER — Encounter: Payer: Self-pay | Admitting: *Deleted

## 2016-05-21 ENCOUNTER — Encounter
Admission: RE | Disposition: A | Discharge status: Home / Self Care | Payer: Self-pay | Source: Ambulatory Visit | Attending: General Surgery

## 2016-05-21 DIAGNOSIS — Z7982 Long term (current) use of aspirin: Secondary | ICD-10-CM | POA: Diagnosis not present

## 2016-05-21 DIAGNOSIS — Z8673 Personal history of transient ischemic attack (TIA), and cerebral infarction without residual deficits: Secondary | ICD-10-CM | POA: Diagnosis not present

## 2016-05-21 DIAGNOSIS — E785 Hyperlipidemia, unspecified: Secondary | ICD-10-CM | POA: Diagnosis not present

## 2016-05-21 DIAGNOSIS — Z1211 Encounter for screening for malignant neoplasm of colon: Secondary | ICD-10-CM | POA: Diagnosis not present

## 2016-05-21 DIAGNOSIS — F419 Anxiety disorder, unspecified: Secondary | ICD-10-CM | POA: Insufficient documentation

## 2016-05-21 DIAGNOSIS — J449 Chronic obstructive pulmonary disease, unspecified: Secondary | ICD-10-CM | POA: Diagnosis not present

## 2016-05-21 DIAGNOSIS — F329 Major depressive disorder, single episode, unspecified: Secondary | ICD-10-CM | POA: Insufficient documentation

## 2016-05-21 DIAGNOSIS — R634 Abnormal weight loss: Secondary | ICD-10-CM | POA: Insufficient documentation

## 2016-05-21 DIAGNOSIS — R933 Abnormal findings on diagnostic imaging of other parts of digestive tract: Secondary | ICD-10-CM | POA: Insufficient documentation

## 2016-05-21 DIAGNOSIS — F17210 Nicotine dependence, cigarettes, uncomplicated: Secondary | ICD-10-CM | POA: Diagnosis not present

## 2016-05-21 HISTORY — PX: COLONOSCOPY WITH PROPOFOL: SHX5780

## 2016-05-21 SURGERY — COLONOSCOPY WITH PROPOFOL
Anesthesia: General

## 2016-05-21 MED ORDER — PROPOFOL 10 MG/ML IV BOLUS
INTRAVENOUS | Status: DC | PRN
  Administered 2016-05-21: 50 mg via INTRAVENOUS

## 2016-05-21 MED ORDER — LIDOCAINE 2% (20 MG/ML) 5 ML SYRINGE
INTRAMUSCULAR | Status: AC
  Filled 2016-05-21: qty 5

## 2016-05-21 MED ORDER — PROPOFOL 500 MG/50ML IV EMUL
INTRAVENOUS | Status: DC | PRN
  Administered 2016-05-21: 75 ug/kg/min via INTRAVENOUS

## 2016-05-21 MED ORDER — SODIUM CHLORIDE 0.9 % IV SOLN
INTRAVENOUS | Status: DC
  Administered 2016-05-21: 1000 mL via INTRAVENOUS

## 2016-05-21 MED ORDER — LACTATED RINGERS IV SOLN
INTRAVENOUS | Status: DC | PRN
  Administered 2016-05-21: 12:00:00 via INTRAVENOUS

## 2016-05-21 MED ORDER — PROPOFOL 500 MG/50ML IV EMUL
INTRAVENOUS | Status: AC
  Filled 2016-05-21: qty 50

## 2016-05-21 NOTE — Anesthesia Preprocedure Evaluation (Signed)
Anesthesia Evaluation  Patient identified by MRN, date of birth, ID band Patient awake    Reviewed: Allergy & Precautions, NPO status , Patient's Chart, lab work & pertinent test results  Airway Mallampati: II       Dental  (+) Upper Dentures, Lower Dentures   Pulmonary COPD, Current Smoker,     + decreased breath sounds      Cardiovascular Exercise Tolerance: Good  Rhythm:Regular     Neuro/Psych Seizures -,  Anxiety Depression TIA   GI/Hepatic negative GI ROS, Neg liver ROS,   Endo/Other  negative endocrine ROS  Renal/GU negative Renal ROS     Musculoskeletal negative musculoskeletal ROS (+)   Abdominal Normal abdominal exam  (+)   Peds  Hematology   Anesthesia Other Findings   Reproductive/Obstetrics                             Anesthesia Physical Anesthesia Plan  ASA: III  Anesthesia Plan: General   Post-op Pain Management:    Induction: Intravenous  Airway Management Planned: Natural Airway and Nasal Cannula  Additional Equipment:   Intra-op Plan:   Post-operative Plan:   Informed Consent: I have reviewed the patients History and Physical, chart, labs and discussed the procedure including the risks, benefits and alternatives for the proposed anesthesia with the patient or authorized representative who has indicated his/her understanding and acceptance.     Plan Discussed with: CRNA  Anesthesia Plan Comments:         Anesthesia Quick Evaluation

## 2016-05-21 NOTE — H&P (View-Only) (Signed)
Patient ID: Kim CorneaLisa C Gallagher, female   DOB: Jul 31, 1965, 50 y.o.   MRN: [id number]  Chief Complaint  Patient presents with  . Other    weight loss    HPI Kim CorneaLisa C Gallagher is a 50 y.o. female here today for evaluation of weight loss. No pain with eating and has some nausea when she wakes up in the morning .Patient states she has lost at least 20 pound in the last month. She moves her bowels daily but always diarrhea. Ct scan done on 05/01/2016.  I have reviewed the history of present illness with the patient.  HPI  Past Medical History:  Diagnosis Date  . Anxiety   . Chronic major depressive disorder   . Dysuria   . Heavy smoker   . History of TIA (transient ischemic attack)   . Hyperlipidemia, acquired   . Seizures (HCC)   . Urinary incontinence in female     Past Surgical History:  Procedure Laterality Date  . TUBAL LIGATION      Family History  Problem Relation Age of Onset  . Kidney disease Mother   . Hypertension Mother   . Depression Mother   . Diabetes Mother   . Stroke Father   . Hypertension Father   . Cancer Father     thyroid  . Diabetes Father   . Diabetes Sister   . Diabetes Brother     Social History Social History  Substance Use Topics  . Smoking status: Current Every Day Smoker    Packs/day: 1.00    Types: Cigarettes  . Smokeless tobacco: Never Used  . Alcohol use No    Allergies  Allergen Reactions  . Sulfa Antibiotics Rash    Current Outpatient Prescriptions  Medication Sig Dispense Refill  . aspirin 325 MG tablet Take 325 mg by mouth daily.    Marland Kitchen. FLUoxetine (PROZAC) 40 MG capsule Take 1 capsule (40 mg total) by mouth daily. (this replaces sertraline) 30 capsule 2  . mirtazapine (REMERON) 15 MG tablet Take 0.5 tablets (7.5 mg total) by mouth at bedtime. For sleep and to help with weight gain 15 tablet 2  . phenytoin (DILANTIN) 100 MG ER capsule Take 300 mg by mouth at bedtime.    . polyethylene glycol powder (GLYCOLAX/MIRALAX) powder Take 255 g  by mouth once. 255 g 0   No current facility-administered medications for this visit.     Review of Systems Review of Systems  Constitutional: Positive for appetite change, fatigue and unexpected weight change.  Respiratory: Negative.   Cardiovascular: Negative.   Gastrointestinal: Positive for diarrhea.    Blood pressure 110/68, pulse 66, resp. rate 12, height 5\' 5"  (1.651 m), weight 112 lb (50.8 kg).  Physical Exam Physical Exam  Constitutional: She is oriented to person, place, and time. She appears well-developed and well-nourished.  Eyes: Conjunctivae are normal. No scleral icterus.  Neck: Neck supple.  Cardiovascular: Normal rate, regular rhythm and normal heart sounds.   Pulmonary/Chest: Effort normal and breath sounds normal.  Abdominal: Soft. Bowel sounds are normal. She exhibits no mass. There is no hepatosplenomegaly. There is no tenderness. No hernia.  Lymphadenopathy:    She has no cervical adenopathy.    She has no axillary adenopathy.  Neurological: She is alert and oriented to person, place, and time.  Skin: Skin is warm and dry.    Data Reviewed Notes and ct scan reviewed  Basic labs 1 mo ago were normal. CT- suspicious for a mass in proximal transverse  colon. Calcified gallbladder wall. Assessment    1. Wight loss, unexplained. 2. CT suggesting a possible transverse colon lesion and calcified gallbladder wall. 3. Smoking history    Plan   Pt advised on CT findings. Need for colonoscopy explained. Risk of cancer associated with gallbladder wall calcification also explained. Pt agreeable to colonoscopy    Colonoscopy with possible biopsy/polypectomy prn: Information regarding the procedure, including its potential risks and complications (including but not limited to perforation of the bowel, which may require emergency surgery to repair, and bleeding) was verbally given to the patient. Educational information regarding lower intestinal endoscopy was given  to the patient. Written instructions for how to complete the bowel prep using Miralax were provided. The importance of drinking ample fluids to avoid dehydration as a result of the prep emphasized.  Discussed quit smoking with patient she states she will not quit.  The patient is scheduled for a Colonoscopy at [facility] on 05/13/16. They are aware to call the day before to get their arrival time. She will decrease her Aspirin to 81mg daily starting tomorrow.  Miralax prescription has been sent into the patient's pharmacy. The patient is aware of date and instructions.    This information has been scribed by Jessica Qualls CMA.'  SANKAR,SEEPLAPUTHUR G 05/09/2016, 10:45 AM   

## 2016-05-21 NOTE — Op Note (Signed)
[facility] Patient Name: Kim LampLisa Gallagher Procedure Date: 05/21/2016 11:52 AM MRN: [id number] Account #: [id number] Date of Birth: Sep 15, 1965 Admit Type: Outpatient Age: 50 [facility]OM 1 Gender: Female Note Status: Finalized Procedure:            Colonoscopy Indications:          Abnormal CT of the GI tract, Weight loss Providers:            Seeplaputhur G. Evette CristalSankar, MD Referring MD:         Kerman PasseyMelinda P. Lada (Referring MD) Medicines:            General Anesthesia Complications:        No immediate complications. Procedure:            Pre-Anesthesia Assessment:                       - General anesthesia under the supervision of an                        anesthesiologist was determined to be medically                        necessary for this procedure based on review of the                        patient's medical history, medications, and prior                        anesthesia history.                       After obtaining informed consent, the colonoscope was                        passed under direct vision. Throughout the procedure,                        the patient's blood pressure, pulse, and oxygen                        saturations were monitored continuously. The                        Colonoscope was introduced through the anus and                        advanced to the the cecum, identified by the ileocecal                        valve. The colonoscopy was performed without                        difficulty. The patient tolerated the procedure well.                        The quality of the bowel preparation was adequate to                        identify polyps 6 mm and larger in size. Findings:      The perianal and digital rectal examinations were normal.  The entire examined colon appeared normal. Impression:           - The entire examined colon is normal.                       - No specimens collected. Recommendation:        - Discharge patient to home.                       - Resume previous diet.                       - Return to my office in 1 week. Procedure Code(s):    --- Professional ---                       [phone] [id number], Colonoscopy, flexible; diagnostic, including                        collection of specimen(s) by brushing or washing, when                        performed (separate procedure) Diagnosis Code(s):    --- Professional ---                       R63.4, Abnormal weight loss                       R93.3, Abnormal findings on diagnostic imaging of other                        parts of digestive tract CPT copyright 2016 American Medical Association. All rights reserved. The codes documented in this report are preliminary and upon coder review may  be revised to meet current compliance requirements. Kieth BrightlySeeplaputhur G Sankar, MD 05/21/2016 12:19:50 PM This report has been signed electronically. Number of Addenda: 0 Note Initiated On: 05/21/2016 11:52 AM Scope Withdrawal Time: 0 hours 3 minutes 38 seconds  Total Procedure Duration: 0 hours 18 minutes 32 seconds       [facility]

## 2016-05-21 NOTE — Interval H&P Note (Signed)
History and Physical Interval Note:  05/21/2016 12:26 PM  Kim CorneaLisa C Gallagher  has presented today for surgery, with the diagnosis of WT LOSS SCREENING ABNORMAL IMAGING  The various methods of treatment have been discussed with the patient and family. After consideration of risks, benefits and other options for treatment, the patient has consented to  Procedure(s): COLONOSCOPY WITH PROPOFOL (N/A) as a surgical intervention .  The patient's history has been reviewed, patient examined, no change in status, stable for surgery.  I have reviewed the patient's chart and labs.  Questions were answered to the patient's satisfaction.     Charlsey Moragne G

## 2016-05-21 NOTE — Anesthesia Postprocedure Evaluation (Signed)
Anesthesia Post Note  Patient: Kim Gallagher  Procedure(s) Performed: Procedure(s) (LRB): COLONOSCOPY WITH PROPOFOL (N/A)  Patient location during evaluation: PACU Anesthesia Type: General Level of consciousness: awake Pain management: pain level controlled Vital Signs Assessment: post-procedure vital signs reviewed and stable Respiratory status: spontaneous breathing Cardiovascular status: stable Anesthetic complications: no     Last Vitals:  Vitals:   05/21/16 1222 05/21/16 1223  BP: 131/72 131/72  Pulse: 82 83  Resp: 14 19  Temp:  36.1 C    Last Pain:  Vitals:   05/21/16 1223  TempSrc: Tympanic                 VAN STAVEREN,Birdell Frasier

## 2016-05-21 NOTE — Transfer of Care (Signed)
Immediate Anesthesia Transfer of Care Note  Patient: Kim CorneaLisa C Gallagher  Procedure(s) Performed: Procedure(s): COLONOSCOPY WITH PROPOFOL (N/A)  Patient Location: PACU  Anesthesia Type:General  Level of Consciousness: awake, alert , oriented and patient cooperative  Airway & Oxygen Therapy: Patient Spontanous Breathing and Patient connected to nasal cannula oxygen  Post-op Assessment: Report given to RN, Post -op Vital signs reviewed and stable and Patient moving all extremities X 4  Post vital signs: Reviewed and stable  Last Vitals:  Vitals:   05/21/16 1222 05/21/16 1223  BP: 131/72 (P) 131/72  Pulse: 82 (P) 83  Resp: 14 (P) 19  Temp:  (P) 36.1 C    Last Pain:  Vitals:   05/21/16 1223  TempSrc: (P) Tympanic         Complications: No apparent anesthesia complications

## 2016-05-22 ENCOUNTER — Encounter: Payer: Self-pay | Admitting: General Surgery

## 2016-05-28 ENCOUNTER — Ambulatory Visit: Payer: Self-pay | Admitting: Family Medicine

## 2016-05-28 ENCOUNTER — Ambulatory Visit: Payer: Medicare HMO | Admitting: General Surgery

## 2016-06-03 ENCOUNTER — Ambulatory Visit: Payer: Medicare HMO | Attending: Family Medicine

## 2016-06-03 ENCOUNTER — Other Ambulatory Visit: Payer: Medicare HMO

## 2016-06-10 ENCOUNTER — Ambulatory Visit: Payer: Medicare HMO | Admitting: General Surgery

## 2016-06-10 ENCOUNTER — Encounter: Payer: Self-pay | Admitting: Family Medicine

## 2016-06-10 ENCOUNTER — Ambulatory Visit (INDEPENDENT_AMBULATORY_CARE_PROVIDER_SITE_OTHER): Payer: Medicare HMO | Admitting: Family Medicine

## 2016-06-10 DIAGNOSIS — R634 Abnormal weight loss: Secondary | ICD-10-CM

## 2016-06-10 DIAGNOSIS — R748 Abnormal levels of other serum enzymes: Secondary | ICD-10-CM

## 2016-06-10 DIAGNOSIS — K828 Other specified diseases of gallbladder: Secondary | ICD-10-CM | POA: Diagnosis not present

## 2016-06-10 DIAGNOSIS — N899 Noninflammatory disorder of vagina, unspecified: Secondary | ICD-10-CM

## 2016-06-10 DIAGNOSIS — N63 Unspecified lump in unspecified breast: Secondary | ICD-10-CM

## 2016-06-10 DIAGNOSIS — R61 Generalized hyperhidrosis: Secondary | ICD-10-CM

## 2016-06-10 DIAGNOSIS — E049 Nontoxic goiter, unspecified: Secondary | ICD-10-CM | POA: Insufficient documentation

## 2016-06-10 DIAGNOSIS — F419 Anxiety disorder, unspecified: Secondary | ICD-10-CM

## 2016-06-10 DIAGNOSIS — E559 Vitamin D deficiency, unspecified: Secondary | ICD-10-CM | POA: Diagnosis not present

## 2016-06-10 DIAGNOSIS — F17200 Nicotine dependence, unspecified, uncomplicated: Secondary | ICD-10-CM

## 2016-06-10 DIAGNOSIS — E538 Deficiency of other specified B group vitamins: Secondary | ICD-10-CM | POA: Diagnosis not present

## 2016-06-10 DIAGNOSIS — N898 Other specified noninflammatory disorders of vagina: Secondary | ICD-10-CM

## 2016-06-10 LAB — TSH: TSH: 1.09 m[IU]/L

## 2016-06-10 LAB — CBC WITH DIFFERENTIAL/PLATELET
BASOS PCT: 1 %
Basophils Absolute: 56 cells/uL (ref 0–200)
EOS PCT: 3 %
Eosinophils Absolute: 168 cells/uL (ref 15–500)
HCT: 45.5 % — ABNORMAL HIGH (ref 35.0–45.0)
Hemoglobin: 15.4 g/dL (ref 11.7–15.5)
LYMPHS PCT: 34 %
Lymphs Abs: 1904 cells/uL (ref 850–3900)
MCH: 33.6 pg — ABNORMAL HIGH (ref 27.0–33.0)
MCHC: 33.8 g/dL (ref 32.0–36.0)
MCV: 99.1 fL (ref 80.0–100.0)
MONOS PCT: 6 %
MPV: 8.9 fL (ref 7.5–12.5)
Monocytes Absolute: 336 cells/uL (ref 200–950)
Neutro Abs: 3136 cells/uL (ref 1500–7800)
Neutrophils Relative %: 56 %
PLATELETS: 303 10*3/uL (ref 140–400)
RBC: 4.59 MIL/uL (ref 3.80–5.10)
RDW: 15.5 % — AB (ref 11.0–15.0)
WBC: 5.6 10*3/uL (ref 3.8–10.8)

## 2016-06-10 LAB — COMPLETE METABOLIC PANEL WITH GFR
ALT: 10 U/L (ref 6–29)
AST: 18 U/L (ref 10–35)
Albumin: 4 g/dL (ref 3.6–5.1)
Alkaline Phosphatase: 121 U/L (ref 33–130)
BILIRUBIN TOTAL: 0.3 mg/dL (ref 0.2–1.2)
BUN: 11 mg/dL (ref 7–25)
CHLORIDE: 103 mmol/L (ref 98–110)
CO2: 26 mmol/L (ref 20–31)
CREATININE: 0.65 mg/dL (ref 0.50–1.05)
Calcium: 9.3 mg/dL (ref 8.6–10.4)
GFR, Est Non African American: >89 mL/min (ref >=60)
GLUCOSE: 86 mg/dL (ref 65–99)
Potassium: 5.7 mmol/L — ABNORMAL HIGH (ref 3.5–5.3)
SODIUM: 136 mmol/L (ref 135–146)
Total Protein: 6.7 g/dL (ref 6.1–8.1)

## 2016-06-10 LAB — T3, FREE: T3 FREE: 3 pg/mL (ref 2.3–4.2)

## 2016-06-10 LAB — T4, FREE: FREE T4: 0.9 ng/dL (ref 0.8–1.8)

## 2016-06-10 LAB — VITAMIN B12

## 2016-06-10 MED ORDER — FLUOXETINE HCL 60 MG PO TABS: 1.0000 | ORAL_TABLET | Freq: Every day | ORAL | 5 refills | Status: DC

## 2016-06-10 MED ORDER — CYANOCOBALAMIN 1000 MCG/ML IJ SOLN
1000.0000 ug | Freq: Once | INTRAMUSCULAR | Status: AC
  Administered 2016-06-10: 1000 ug via INTRAMUSCULAR

## 2016-06-10 NOTE — Assessment & Plan Note (Signed)
Patient is seeing surgeon; plans to have cholecystectomy soon; will get her plugged into the cancer center to see if any other testing (PET scan?) to help look for potential source of malignancy

## 2016-06-10 NOTE — Patient Instructions (Addendum)
Let's get labs first After I see those results, we may refer you over to the cancer center Keep the appointments for the mammogram and ultrasound, and with Dr. Evette CristalSankar and Dr. Greggory KeeneFrancesco I've put in the referral to the psychiatrist Increase the fluoxetine from 40 mg to 60 mg Return in 3 weeks

## 2016-06-10 NOTE — Assessment & Plan Note (Signed)
Appointment pending with gynecologist

## 2016-06-10 NOTE — Assessment & Plan Note (Signed)
She is not interested in qutting at this point; I am here to help when ready

## 2016-06-10 NOTE — Assessment & Plan Note (Signed)
Patient missed appt for mammo and US; she agrees to go if we reschedule; staff rescheduling now

## 2016-06-10 NOTE — Assessment & Plan Note (Signed)
With brisk reflexes; last TSH was normal, but will check again along with free T3 and T4

## 2016-06-10 NOTE — Assessment & Plan Note (Signed)
Increase SSRI; notify me of manic or hypomanic symptoms

## 2016-06-10 NOTE — Assessment & Plan Note (Signed)
Offer monthly B12 injections for 3 months to bring levels back up

## 2016-06-10 NOTE — Assessment & Plan Note (Signed)
Check level and supplement vit D if needed; may be affecting energy

## 2016-06-10 NOTE — Assessment & Plan Note (Signed)
Refer to cancer center for evaluation to see if additional testing (PET scan? Nuclear medicine scan?) might help with delineation of source; she missed her mammogram and US appointments, and staff is rescheduling those today while she is here; she is going to have choly soon for the porcelain gallbladder; if stress is part of the problem, will be having her seen psychiatrist; check with case worker to see if housing vouchers, transportation vouchers available to help with making appointments, easing her stress

## 2016-06-10 NOTE — Assessment & Plan Note (Signed)
Has seen surgeon and has plans to undergo cholecystectomy soon

## 2016-06-10 NOTE — Assessment & Plan Note (Signed)
Those have resolved per patient

## 2016-06-10 NOTE — Progress Notes (Signed)
BP 110/70   Pulse 87   Temp 99 F (37.2 C) (Oral)   Resp 16   Wt 112 lb 8 oz (51 kg)   SpO2 97%   BMI 18.16 kg/m    Subjective:    Patient ID: Kim Gallagher, female    DOB: 09/10/1965, 51 y.o.   MRN: [id number]  HPI: Kim Gallagher is a 51 y.o. female  Chief Complaint  Patient presents with  . Follow-up    weight check   Patient is here for f/u Weight has stabilized; appetite is not good; she says she is anxious and I took away her benzodiazepine; she is also homeless and she has stress associated with that; she continues to smoke marijuana and regular cigarettes and is not interested in quitting Had colonoscopy and no evidence of colon cancer, but she needs to have gallbladder taken out She goes to see the surgeon today or Thursday for consideration of surgery No abdominal pain No pain anywhere  She missed her appt for the diagnostic mammogram and [place]  Lots of stress with transportation and living arrangements She has a case worker  Vit D was 9.6 in June; low B12  She would like to see a psychiatrist; referral was entered in November and the gentleman was retiring and not accepting new patients  Depression screen [facility] 2/9 06/10/2016 04/29/2016 03/18/2016 11/02/2015 04/05/2015  Decreased Interest 3 1 0 0 0  Down, Depressed, Hopeless 3 3 0 0 0  PHQ - 2 Score 6 4 0 0 0  Altered sleeping 3 3 - - -  Tired, decreased energy 3 3 - - -  Change in appetite 3 3 - - -  Feeling bad or failure about yourself  3 3 - - -  Trouble concentrating 3 3 - - -  Moving slowly or fidgety/restless 3 0 - - -  Suicidal thoughts 0 0 - - -  PHQ-9 Score 24 19 - - -  Difficult doing work/chores Not difficult at all Somewhat difficult - - -  no SI/HI Would not act on anything she says  Relevant past medical, surgical, family and social history reviewed Past Medical History:  Diagnosis Date  . Anxiety   . Chronic major depressive disorder   . Dysuria   . Heavy smoker   . History of TIA  (transient ischemic attack)   . Hyperlipidemia, acquired   . Seizures (HCC)   . Stroke (HCC) 11/16/2006  . Urinary incontinence in female    Past Surgical History:  Procedure Laterality Date  . COLONOSCOPY WITH PROPOFOL N/A 05/13/2016   Procedure: COLONOSCOPY WITH PROPOFOL;  Surgeon: Kieth Brightly, MD;  Location: [facility] ENDOSCOPY;  Service: Endoscopy;  Laterality: N/A;  . COLONOSCOPY WITH PROPOFOL N/A 05/21/2016   Procedure: COLONOSCOPY WITH PROPOFOL;  Surgeon: Kieth Brightly, MD;  Location: [facility] ENDOSCOPY;  Service: Endoscopy;  Laterality: N/A;  . TUBAL LIGATION     Family History  Problem Relation Age of Onset  . Kidney disease Mother   . Hypertension Mother   . Depression Mother   . Diabetes Mother   . Stroke Father   . Hypertension Father   . Cancer Father     thyroid  . Diabetes Father   . Diabetes Sister   . Diabetes Brother    Social History  Substance Use Topics  . Smoking status: Current Every Day Smoker    Packs/day: 1.00    Types: Cigarettes  . Smokeless tobacco: Never Used  .  Alcohol use No   Interim medical history since last visit reviewed. Allergies and medications reviewed  Review of Systems Per HPI unless specifically indicated above     Objective:    BP 110/70   Pulse 87   Temp 99 F (37.2 C) (Oral)   Resp 16   Wt 112 lb 8 oz (51 kg)   SpO2 97%   BMI 18.16 kg/m   Wt Readings from Last 3 Encounters:  06/12/16 112 lb (50.8 kg)  06/10/16 112 lb 8 oz (51 kg)  05/21/16 112 lb (50.8 kg)    Physical Exam  Constitutional: She appears well-developed and well-nourished. No distress.  Thin chronically ill appearing female  HENT:  Nose: No epistaxis.  Repeated sniffing, very frequent  Eyes: EOM are normal. No scleral icterus.  Neck: No thyromegaly present.  Cardiovascular: Normal rate and regular rhythm.   Pulmonary/Chest: Effort normal and breath sounds normal.  Abdominal: She exhibits no distension.  Musculoskeletal: She  exhibits no edema.  Neurological: She is alert.  Skin: No pallor.  Aged ahead of years consistent with heavy cigarette smoking  Psychiatric: Her behavior is normal. Judgment and thought content normal. Her mood appears anxious. Cognition and memory are not impaired. She does not exhibit a depressed mood.      Assessment & Plan:   Problem List Items Addressed This Visit      Digestive   Porcelain gallbladder    Patient is seeing surgeon; plans to have cholecystectomy soon; will get her plugged into the cancer center to see if any other testing (PET scan?) to help look for potential source of malignancy        Endocrine   Goiter, nodular    With brisk reflexes; last TSH was normal, but will check again along with free T3 and T4      Relevant Orders   TSH (Completed)   T3, free (Completed)   T4, free (Completed)     Other   Vitamin D deficiency    Check level and supplement vit D if needed; may be affecting energy      Relevant Orders   VITAMIN D 25 Hydroxy (Vit-D Deficiency, Fractures) (Completed)   Vitamin B12 deficiency    Offer monthly B12 injections for 3 months to bring levels back up      Relevant Medications   cyanocobalamin ((VITAMIN B-12)) injection 1,000 mcg (Completed)   Other Relevant Orders   Vitamin B12 (Completed)   Vaginal mass    Appointment pending with gynecologist      Night sweats    Those have resolved per patient      Heavy tobacco smoker    She is not interested in qutting at this point; I am here to help when ready      Breast lump or mass    Patient missed appt for mammo and US; she agrees to go if we reschedule; staff rescheduling now      Anxiety    Increase SSRI; notify me of manic or hypomanic symptoms      Relevant Medications   FLUoxetine HCl 60 MG TABS   Other Relevant Orders   Ambulatory referral to Psychiatry   Alkaline phosphatase elevation    Has seen surgeon and has plans to undergo cholecystectomy soon       Relevant Orders   COMPLETE METABOLIC PANEL WITH GFR (Completed)   Abnormal weight loss    Refer to cancer center for evaluation to see if additional testing (PET  scan? Nuclear medicine scan?) might help with delineation of source; she missed her mammogram and [place] appointments, and staff is rescheduling those today while she is here; she is going to have choly soon for the porcelain gallbladder; if stress is part of the problem, will be having her seen psychiatrist; check with case worker to see if housing vouchers, transportation vouchers available to help with making appointments, easing her stress      Relevant Orders   TSH (Completed)   T3, free (Completed)   T4, free (Completed)   CBC with Differential/Platelet (Completed)       Follow up plan: Return in about 3 weeks (around 07/01/2016) for follow-up.  An after-visit summary was printed and given to the patient at check-out.  Please see the patient instructions which may contain other information and recommendations beyond what is mentioned above in the assessment and plan.  Meds ordered this encounter  Medications  . cyanocobalamin ((VITAMIN B-12)) injection 1,000 mcg  . FLUoxetine HCl 60 MG TABS    Sig: Take 1 tablet by mouth daily.    Dispense:  30 tablet    Refill:  5    Changing dose    Orders Placed This Encounter  Procedures  . TSH  . T3, free  . T4, free  . CBC with Differential/Platelet  . COMPLETE METABOLIC PANEL WITH GFR  . VITAMIN D 25 Hydroxy (Vit-D Deficiency, Fractures)  . Vitamin B12  . Ambulatory referral to Psychiatry

## 2016-06-11 ENCOUNTER — Other Ambulatory Visit: Payer: Self-pay | Admitting: Family Medicine

## 2016-06-11 DIAGNOSIS — E875 Hyperkalemia: Secondary | ICD-10-CM

## 2016-06-11 LAB — VITAMIN D 25 HYDROXY (VIT D DEFICIENCY, FRACTURES): Vit D, 25-Hydroxy: 13 ng/mL — ABNORMAL LOW (ref 30–100)

## 2016-06-11 MED ORDER — VITAMIN D (ERGOCALCIFEROL) 1.25 MG (50000 UNIT) PO CAPS: 50000.0000 [IU] | ORAL_CAPSULE | ORAL | 1 refills | Status: AC

## 2016-06-11 NOTE — Assessment & Plan Note (Signed)
Recheck STAT at the hospital; will ask colleague to look at results; she's not on ACE-I or ARB, so I suspect phlebotomy technique, but it needs to be rechecked

## 2016-06-11 NOTE — Progress Notes (Signed)
Ordered STAT recheck K+ at the hospital today; note to staff in lab section

## 2016-06-12 ENCOUNTER — Other Ambulatory Visit: Payer: Self-pay

## 2016-06-12 ENCOUNTER — Encounter: Payer: Self-pay | Admitting: General Surgery

## 2016-06-12 ENCOUNTER — Ambulatory Visit (INDEPENDENT_AMBULATORY_CARE_PROVIDER_SITE_OTHER): Payer: Medicare HMO | Admitting: General Surgery

## 2016-06-12 VITALS — BP 120/80 | HR 87 | Resp 12 | Ht 62.0 in | Wt 112.0 lb

## 2016-06-12 DIAGNOSIS — E875 Hyperkalemia: Secondary | ICD-10-CM

## 2016-06-12 DIAGNOSIS — K828 Other specified diseases of gallbladder: Secondary | ICD-10-CM

## 2016-06-12 NOTE — Patient Instructions (Signed)
Laparoscopic Cholecystectomy Laparoscopic cholecystectomy is surgery to remove the gallbladder. The gallbladder is a pear-shaped organ that lies beneath the liver on the right side of the body. The gallbladder stores bile, which is a fluid that helps the body to digest fats. Cholecystectomy is often done for inflammation of the gallbladder (cholecystitis). This condition is usually caused by a buildup of gallstones (cholelithiasis) in the gallbladder. Gallstones can block the flow of bile, which can result in inflammation and pain. In severe cases, emergency surgery may be required. This procedure is done though small incisions in your abdomen (laparoscopic surgery). A thin scope with a camera (laparoscope) is inserted through one incision. Thin surgical instruments are inserted through the other incisions. In some cases, a laparoscopic procedure may be turned into a type of surgery that is done through a larger incision (open surgery). Tell a health care provider about:  Any allergies you have.  All medicines you are taking, including vitamins, herbs, eye drops, creams, and over-the-counter medicines.  Any problems you or family members have had with anesthetic medicines.  Any blood disorders you have.  Any surgeries you have had.  Any medical conditions you have.  Whether you are pregnant or may be pregnant. What are the risks? Generally, this is a safe procedure. However, problems may occur, including:  Infection.  Bleeding.  Allergic reactions to medicines.  Damage to other structures or organs.  A stone remaining in the common bile duct. The common bile duct carries bile from the gallbladder into the small intestine.  A bile leak from the cyst duct that is clipped when your gallbladder is removed. What happens before the procedure? Staying hydrated  Follow instructions from your health care provider about hydration, which may include:  Up to 2 hours before the procedure - you  may continue to drink clear liquids, such as water, clear fruit juice, black coffee, and plain tea. Eating and drinking restrictions  Follow instructions from your health care provider about eating and drinking, which may include:  8 hours before the procedure - stop eating heavy meals or foods such as meat, fried foods, or fatty foods.  6 hours before the procedure - stop eating light meals or foods, such as toast or cereal.  6 hours before the procedure - stop drinking milk or drinks that contain milk.  2 hours before the procedure - stop drinking clear liquids. Medicines   Ask your health care provider about:  Changing or stopping your regular medicines. This is especially important if you are taking diabetes medicines or blood thinners.  Taking medicines such as aspirin and ibuprofen. These medicines can thin your blood. Do not take these medicines before your procedure if your health care provider instructs you not to.  You may be given antibiotic medicine to help prevent infection. General instructions   Let your health care provider know if you develop a cold or an infection before surgery.  Plan to have someone take you home from the hospital or clinic.  Ask your health care provider how your surgical site will be marked or identified. What happens during the procedure?  To reduce your risk of infection:  Your health care team will wash or sanitize their hands.  Your skin will be washed with soap.  Hair may be removed from the surgical area.  An IV tube may be inserted into one of your veins.  You will be given one or more of the following:  A medicine to help you relax (  sedative).  A medicine to make you fall asleep (general anesthetic).  A breathing tube will be placed in your mouth.  Your surgeon will make several small cuts (incisions) in your abdomen.  The laparoscope will be inserted through one of the small incisions. The camera on the laparoscope will  send images to a TV screen (monitor) in the operating room. This lets your surgeon see inside your abdomen.  Air-like gas will be pumped into your abdomen. This will expand your abdomen to give the surgeon more room to perform the surgery.  Other tools that are needed for the procedure will be inserted through the other incisions. The gallbladder will be removed through one of the incisions.  Your common bile duct may be examined. If stones are found in the common bile duct, they may be removed.  After your gallbladder has been removed, the incisions will be closed with stitches (sutures), staples, or skin glue.  Your incisions may be covered with a bandage (dressing). The procedure may vary among health care providers and hospitals. What happens after the procedure?  Your blood pressure, heart rate, breathing rate, and blood oxygen level will be monitored until the medicines you were given have worn off.  You will be given medicines as needed to control your pain.  Do not drive for 24 hours if you were given a sedative. This information is not intended to replace advice given to you by your health care provider. Make sure you discuss any questions you have with your health care provider. Document Released: 05/19/2005 Document Revised: 12/09/2015 Document Reviewed: 11/05/2015 Elsevier Interactive Patient Education  2017 [employer]  

## 2016-06-12 NOTE — Progress Notes (Signed)
Patient ID: Kim Gallagher, female   DOB: 14-Jan-1966, 51 y.o.   MRN: [id number]  Chief Complaint  Patient presents with  . Follow-up    HPI Kim Gallagher is a 51 y.o. female here today for her follow up colonoscopy which was done on 05/13/2016. Patient states she is doing well. Patient is also here today to discuss gallbladder removal.  I have reviewed the history of present illness with the patient.  HPI  Past Medical History:  Diagnosis Date  . Anxiety   . Chronic major depressive disorder   . Dysuria   . Heavy smoker   . History of TIA (transient ischemic attack)   . Hyperlipidemia, acquired   . Seizures (HCC)   . Stroke (HCC) 11/16/2006  . Urinary incontinence in female     Past Surgical History:  Procedure Laterality Date  . COLONOSCOPY WITH PROPOFOL N/A 05/13/2016   Procedure: COLONOSCOPY WITH PROPOFOL;  Surgeon: Kieth Brightly, MD;  Location: [facility] ENDOSCOPY;  Service: Endoscopy;  Laterality: N/A;  . COLONOSCOPY WITH PROPOFOL N/A 05/21/2016   Procedure: COLONOSCOPY WITH PROPOFOL;  Surgeon: Kieth Brightly, MD;  Location: [facility] ENDOSCOPY;  Service: Endoscopy;  Laterality: N/A;  . TUBAL LIGATION      Family History  Problem Relation Age of Onset  . Kidney disease Mother   . Hypertension Mother   . Depression Mother   . Diabetes Mother   . Stroke Father   . Hypertension Father   . Cancer Father     thyroid  . Diabetes Father   . Diabetes Sister   . Diabetes Brother     Social History Social History  Substance Use Topics  . Smoking status: Current Every Day Smoker    Packs/day: 1.00    Types: Cigarettes  . Smokeless tobacco: Never Used  . Alcohol use No    Allergies  Allergen Reactions  . Sulfa Antibiotics Rash    Current Outpatient Prescriptions  Medication Sig Dispense Refill  . aspirin 325 MG tablet Take 325 mg by mouth daily.    Marland Kitchen FLUoxetine HCl 60 MG TABS Take 1 tablet by mouth daily. 30 tablet 5  . mirtazapine (REMERON) 15 MG tablet  Take 0.5 tablets (7.5 mg total) by mouth at bedtime. For sleep and to help with weight gain 15 tablet 2  . phenytoin (DILANTIN) 100 MG ER capsule Take 300 mg by mouth at bedtime.    . Vitamin D, Ergocalciferol, (DRISDOL) 50000 units CAPS capsule Take 1 capsule (50,000 Units total) by mouth every 7 (seven) days. 4 capsule 1   No current facility-administered medications for this visit.     Review of Systems Review of Systems  Constitutional: Negative.   Respiratory: Negative.   Cardiovascular: Negative.     Blood pressure 120/80, pulse 87, resp. rate 12, height 5\' 2"  (1.575 m), weight 112 lb (50.8 kg).  Physical Exam Physical Exam  Constitutional: She is oriented to person, place, and time. She appears well-developed and well-nourished.  Eyes: Conjunctivae are normal. No scleral icterus.  Neck: Neck supple.  Cardiovascular: Normal rate, regular rhythm and normal heart sounds.   Pulmonary/Chest: Effort normal and breath sounds normal.  Abdominal: Soft. Bowel sounds are normal. There is no tenderness. There is negative Murphy's sign.  Lymphadenopathy:    She has no cervical adenopathy.  Neurological: She is alert and oriented to person, place, and time.  Skin: Skin is warm and dry.    Data Reviewed Notes and imaging reviewed. Colonoscopy was  normal. CT  prior had suggested some thickening associated with proximal transverse colon Assessment    Calcification of the gallbladder wall- associated risk of cancer Cholelithiasis Due to calcification of the gallbladder wall, unexplained weight loss, and loss of appetite, will proceed with laparoscopic cholecystectomy.    Plan   Pt is agreeable to cholecystectomy    Laparoscopic Cholecystectomy with Intraoperative Cholangiogram. The procedure, including it's potential risks and complications (including but not limited to infection, bleeding, injury to intra-abdominal organs or bile ducts, bile leak, poor cosmetic result, sepsis and  death) were discussed with the patient in detail. Non-operative options, including their inherent risks (acute calculous cholecystitis with possible choledocholithiasis or gallstone pancreatitis, with the risk of ascending cholangitis, sepsis, and death) were discussed as well. The patient expressed and understanding of what we discussed and wishes to proceed with laparoscopic cholecystectomy. The patient further understands that if it is technically not possible, or it is unsafe to proceed laparoscopically, that I will convert to an open cholecystectomy.  Procedure and risks explained to patient.   Requested that patient not smoke at least on the day of the surgery.   Will write prescription for nicotine patch. Ideally, we would like patient to start this 7 days prior to surgery and continue for one week after surgery.   [facility]. This patient will decrease from a 325 mg aspirin to an 81 mg aspirin 3 days prior to surgery.   This information has been scribed by Ples SpecterJessica Qualls CMA.   Euleta Belson G 06/13/2016, 4:37 PM

## 2016-06-13 ENCOUNTER — Telehealth: Payer: Self-pay

## 2016-06-13 NOTE — Telephone Encounter (Signed)
Message left for patient to notify her of her upcoming [facility]. She will pre admit on 07/03/16 at 3:00 pm as requested. She may call back with any questions.

## 2016-06-18 ENCOUNTER — Telehealth: Payer: Self-pay | Admitting: Family Medicine

## 2016-06-18 NOTE — Telephone Encounter (Signed)
Please follow-up on the potassium from earlier; thank you

## 2016-06-20 ENCOUNTER — Other Ambulatory Visit: Payer: Self-pay

## 2016-06-20 ENCOUNTER — Ambulatory Visit
Admission: RE | Admit: 2016-06-20 | Discharge: 2016-06-20 | Disposition: A | Discharge status: Home / Self Care | Payer: Medicare HMO | Source: Ambulatory Visit | Attending: Family Medicine | Admitting: Family Medicine

## 2016-06-20 ENCOUNTER — Ambulatory Visit: Admission: RE | Admit: 2016-06-20 | Payer: Medicare HMO | Source: Ambulatory Visit

## 2016-06-20 DIAGNOSIS — N6001 Solitary cyst of right breast: Secondary | ICD-10-CM | POA: Insufficient documentation

## 2016-06-20 DIAGNOSIS — N6002 Solitary cyst of left breast: Secondary | ICD-10-CM | POA: Diagnosis not present

## 2016-06-20 DIAGNOSIS — E875 Hyperkalemia: Secondary | ICD-10-CM

## 2016-06-20 DIAGNOSIS — N63 Unspecified lump in unspecified breast: Secondary | ICD-10-CM | POA: Diagnosis present

## 2016-06-20 NOTE — Telephone Encounter (Signed)
Ok I will do that. Thanks

## 2016-06-20 NOTE — Telephone Encounter (Signed)
It was ordered 06/11/16 [facility] If it's been canceled, just re-order please

## 2016-06-20 NOTE — Telephone Encounter (Signed)
I don't see an potassium was order. Do you want me to order and ask patient to come in ?

## 2016-06-23 NOTE — Telephone Encounter (Signed)
Dr. lada order an potassium. Called patient to ask if she can come in to get some blood drawn. Patient stated that she might not have transportation therefore I suggest when she get a chance to come in.

## 2016-07-01 ENCOUNTER — Ambulatory Visit (INDEPENDENT_AMBULATORY_CARE_PROVIDER_SITE_OTHER): Payer: Medicare HMO | Admitting: Family Medicine

## 2016-07-01 ENCOUNTER — Encounter: Payer: Self-pay | Admitting: Obstetrics and Gynecology

## 2016-07-01 ENCOUNTER — Encounter: Payer: Self-pay | Admitting: Family Medicine

## 2016-07-01 DIAGNOSIS — N899 Noninflammatory disorder of vagina, unspecified: Secondary | ICD-10-CM | POA: Diagnosis not present

## 2016-07-01 DIAGNOSIS — F1220 Cannabis dependence, uncomplicated: Secondary | ICD-10-CM | POA: Diagnosis not present

## 2016-07-01 DIAGNOSIS — F17200 Nicotine dependence, unspecified, uncomplicated: Secondary | ICD-10-CM | POA: Diagnosis not present

## 2016-07-01 DIAGNOSIS — K639 Disease of intestine, unspecified: Secondary | ICD-10-CM | POA: Diagnosis not present

## 2016-07-01 DIAGNOSIS — E559 Vitamin D deficiency, unspecified: Secondary | ICD-10-CM | POA: Diagnosis not present

## 2016-07-01 DIAGNOSIS — R634 Abnormal weight loss: Secondary | ICD-10-CM

## 2016-07-01 DIAGNOSIS — F418 Other specified anxiety disorders: Secondary | ICD-10-CM

## 2016-07-01 DIAGNOSIS — N898 Other specified noninflammatory disorders of vagina: Secondary | ICD-10-CM

## 2016-07-01 DIAGNOSIS — F3341 Major depressive disorder, recurrent, in partial remission: Secondary | ICD-10-CM | POA: Diagnosis not present

## 2016-07-01 DIAGNOSIS — K828 Other specified diseases of gallbladder: Secondary | ICD-10-CM

## 2016-07-01 DIAGNOSIS — N63 Unspecified lump in unspecified breast: Secondary | ICD-10-CM | POA: Diagnosis not present

## 2016-07-01 DIAGNOSIS — F1290 Cannabis use, unspecified, uncomplicated: Secondary | ICD-10-CM

## 2016-07-01 MED ORDER — BUSPIRONE HCL 15 MG PO TABS: ORAL_TABLET | ORAL | 0 refills | Status: DC

## 2016-07-01 MED ORDER — MIRTAZAPINE 15 MG PO TABS: 15.0000 mg | ORAL_TABLET | Freq: Every day | ORAL | 1 refills | Status: DC

## 2016-07-01 NOTE — Patient Instructions (Addendum)
Please pick up the prescription vitamin D at the pharmacy, called ergocalciferol and take one every week for 8 weeks I do encourage you to quit smoking Call [phone] to sign up for smoking cessation classes You can call 1-800-QUIT-NOW to talk with a smoking cessation coach Please check up front on cancer center referral and gynecology referral and psychiatry referral Use the remeron at night for sleep and weight gain Use the buspirone for anxiety

## 2016-07-01 NOTE — Progress Notes (Signed)
BP 118/78   Pulse 100   Temp 98.3 F (36.8 C) (Oral)   Resp 14   Wt 113 lb 5 oz (51.4 kg)   SpO2 96%   BMI 20.73 kg/m    Subjective:    Patient ID: Kim Gallagher, female    DOB: [birth year], 51 y.o.   MRN: [id number]  HPI: Kim Gallagher is a 51 y.o. female  Chief Complaint  Patient presents with  . Follow-up    3 week f/u  . Immunizations    b-12 shot    She does think her living situation is difficult; person living with gets upset about pet That's stressful for her; he threatened to call [employer] to have animal taken away; she does not think the animal is being abused She does have access to fresh food Surgery to remove gallbladder is February 8th and she goes for pre-op on February 1st When she eats, she does not have any abdominal pain She has not heard back about cancer center doctor Still having anxiety; she asked for her Xanax back and she says that She is still using marijuana and is not interested in stopping that at all She has not been contacted by psychiatry She is not taking remeron, didn't help at all; did not make her sleepy, no bad side effects Vitamin D was really low; taking supplement Rx Vaginal mass; she has not been in to see Dr. Enzo Bi, GYN Transportation is an issue for her CT scan chest, abdomen, and pelvis done May 01, 2016; reviewed She has surgery soon for her gallbladder; she had the colonoscopy already to look at the abnormal colon finding; seeing surgeon  Depression screen [facility] 2/9 07/01/2016 06/10/2016 04/29/2016 03/18/2016 11/02/2015  Decreased Interest 0 3 1 0 0  Down, Depressed, Hopeless 0 3 3 0 0  PHQ - 2 Score 0 6 4 0 0  Altered sleeping '1 3 3 ' - -  Tired, decreased energy '1 3 3 ' - -  Change in appetite '1 3 3 ' - -  Feeling bad or failure about yourself  '1 3 3 ' - -  Trouble concentrating '1 3 3 ' - -  Moving slowly or fidgety/restless 1 3 0 - -  Suicidal thoughts 0 0 0 - -  PHQ-9 Score '6 24 19 ' - -  Difficult doing work/chores Not  difficult at all Not difficult at all Somewhat difficult - -   Relevant past medical, surgical, family and social history reviewed Past Medical History:  Diagnosis Date  . Anxiety   . Chronic major depressive disorder   . Dysuria   . Heavy smoker   . History of TIA (transient ischemic attack)   . Hyperlipidemia, acquired   . Seizures ([place])    r/t to stroke  . Stroke ([place]) 11/16/2006   left sided weakness  . Urinary incontinence in female    Past Surgical History:  Procedure Laterality Date  . CHOLECYSTECTOMY N/A 07/10/2016   Procedure: LAPAROSCOPIC CHOLECYSTECTOMY WITH INTRAOPERATIVE CHOLANGIOGRAM;  Surgeon: Christene Lye, MD;  Location: [facility] ORS;  Service: General;  Laterality: N/A;  . COLONOSCOPY WITH PROPOFOL N/A 05/13/2016   Procedure: COLONOSCOPY WITH PROPOFOL;  Surgeon: Christene Lye, MD;  Location: [facility] END[facility];  Service: Endoscopy;  Laterality: N/A;  . COLONOSCOPY WITH PROPOFOL N/A 05/21/2016   Procedure: COLONOSCOPY WITH PROPOFOL;  Surgeon: Christene Lye, MD;  Location: [facility] END[facility];  Service: Endoscopy;  Laterality: N/A;  . TUBAL LIGATION     Family History  Problem  Relation Age of Onset  . Kidney disease Mother   . Hypertension Mother   . Depression Mother   . Diabetes Mother   . Stroke Father   . Hypertension Father   . Cancer Father     thyroid  . Diabetes Father   . Diabetes Sister   . Diabetes Brother    Social History  Substance Use Topics  . Smoking status: Current Every Day Smoker    Packs/day: 1.00    Types: Cigarettes  . Smokeless tobacco: Never Used  . Alcohol use No    Interim medical history since last visit reviewed. Allergies and medications reviewed  Review of Systems Per HPI unless specifically indicated above     Objective:    BP 118/78   Pulse 100   Temp 98.3 F (36.8 C) (Oral)   Resp 14   Wt 113 lb 5 oz (51.4 kg)   SpO2 96%   BMI 20.73 kg/m   Wt Readings from Last 3 Encounters:  07/03/16 113  lb (51.3 kg)  07/01/16 113 lb 5 oz (51.4 kg)  06/12/16 112 lb (50.8 kg)    Physical Exam  Constitutional: She appears well-developed and well-nourished. No distress.  Thin chronically ill appearing female  HENT:  Nose: No epistaxis.  Repeated sniffing, very frequent  Eyes: EOM are normal. No scleral icterus.  Neck: No thyromegaly present.  Cardiovascular: Normal rate and regular rhythm.   Pulmonary/Chest: Effort normal and breath sounds normal.  Abdominal: She exhibits no distension.  Musculoskeletal: She exhibits no edema.  Neurological: She is alert.  Skin: No pallor.  Aged ahead of years consistent with heavy cigarette smoking  Psychiatric: Her behavior is normal. Judgment and thought content normal. Her mood appears anxious. Cognition and memory are not impaired. She does not exhibit a depressed mood.   Results for orders placed or performed in visit on 06/10/16  TSH  Result Value Ref Range   TSH 1.09 mIU/L  T3, free  Result Value Ref Range   T3, Free 3.0 2.3 - 4.2 pg/mL  T4, free  Result Value Ref Range   Free T4 0.9 0.8 - 1.8 ng/dL  CBC with Differential/Platelet  Result Value Ref Range   WBC 5.6 3.8 - 10.8 K/uL   RBC 4.59 3.80 - 5.10 MIL/uL   Hemoglobin 15.4 11.7 - 15.5 g/dL   HCT 45.5 (H) 35.0 - 45.0 %   MCV 99.1 80.0 - 100.0 fL   MCH 33.6 (H) 27.0 - 33.0 pg   MCHC 33.8 32.0 - 36.0 g/dL   RDW 15.5 (H) 11.0 - 15.0 %   Platelets 303 140 - 400 K/uL   MPV 8.9 7.5 - 12.5 fL   Neutro Abs 3,136 1,500 - 7,800 cells/uL   Lymphs Abs 1,904 850 - 3,900 cells/uL   Monocytes Absolute 336 200 - 950 cells/uL   Eosinophils Absolute 168 15 - 500 cells/uL   Basophils Absolute 56 0 - 200 cells/uL   Neutrophils Relative % 56 %   Lymphocytes Relative 34 %   Monocytes Relative 6 %   Eosinophils Relative 3 %   Basophils Relative 1 %   Smear Review Criteria for review not met   COMPLETE METABOLIC PANEL WITH GFR  Result Value Ref Range   Sodium 136 135 - 146 mmol/L   Potassium  5.7 (H) 3.5 - 5.3 mmol/L   Chloride 103 98 - 110 mmol/L   CO2 26 20 - 31 mmol/L   Glucose, Bld 86 65 -  99 mg/dL   BUN 11 7 - 25 mg/dL   Creat 0.65 0.50 - 1.05 mg/dL   Total Bilirubin 0.3 0.2 - 1.2 mg/dL   Alkaline Phosphatase 121 33 - 130 U/L   AST 18 10 - 35 U/L   ALT 10 6 - 29 U/L   Total Protein 6.7 6.1 - 8.1 g/dL   Albumin 4.0 3.6 - 5.1 g/dL   Calcium 9.3 8.6 - 10.4 mg/dL   GFR, Est African American >89 >=60 mL/min   GFR, Est Non African American >89 >=60 mL/min  VITAMIN D 25 Hydroxy (Vit-D Deficiency, Fractures)  Result Value Ref Range   Vit D, 25-Hydroxy 13 (L) 30 - 100 ng/mL  Vitamin B12  Result Value Ref Range   Vitamin B-12 >2000 (H) 200 - 1100 pg/mL      Assessment & Plan:   Problem List Items Addressed This Visit      Digestive   Porcelain gallbladder    Due to have cholecystectomy next week      Colonic thickening    Noted on CT scan, transverse colon; managed, evaluated by surgeon; s/p colonoscopy        Other   Vitamin D deficiency    Encouraged patient to pick up the Rx vitamin D which should bring her level up much more quickly than OTC supplements; she agrees      Vaginal mass    Patient to see GYN for evaluation      Marijuana smoker ([place])    I explained that I am not willing to prescribe her benzo as requested if she is going to continue to smoke marijuana      Major depressive disorder, recurrent episode, in partial remission with anxious distress ([place])    Patient to see psychiatrist, as other than malignancy work-up, psychiatric reasons are in ddx for her weight loss      Relevant Medications   mirtazapine (REMERON) 15 MG tablet   busPIRone (BUSPAR) 15 MG tablet   Heavy tobacco smoker    Patient does not have any desire to quit smoking      Breast lump or mass    Negative mammogram on 06/20/2016      Abnormal weight loss    This appears to have stabilized; patient and I discussed ddx, including malignancy, lack of nutrition,  stress; her thyroid testing was normal         Follow up plan: Return in about 6 weeks (around 08/12/2016) for follow-up.  An after-visit summary was printed and given to the patient at [place].  Please see the patient instructions which may contain other information and recommendations beyond what is mentioned above in the assessment and plan.  Meds ordered this encounter  Medications  . mirtazapine (REMERON) 15 MG tablet    Sig: Take 1 tablet (15 mg total) by mouth at bedtime. For sleep and to help with weight gain    Dispense:  30 tablet    Refill:  1  . busPIRone (BUSPAR) 15 MG tablet    Sig: One-half of a pill by mouth twice a day for one week, then one whole pill twice a day    Dispense:  53 tablet    Refill:  0    No orders of the defined types were placed in this encounter.

## 2016-07-03 ENCOUNTER — Encounter
Admission: RE | Admit: 2016-07-03 | Discharge: 2016-07-03 | Disposition: A | Discharge status: Home / Self Care | Payer: Medicare HMO | Source: Ambulatory Visit | Attending: General Surgery | Admitting: General Surgery

## 2016-07-03 DIAGNOSIS — Z01818 Encounter for other preprocedural examination: Secondary | ICD-10-CM | POA: Insufficient documentation

## 2016-07-03 NOTE — Patient Instructions (Signed)
  Your procedure is scheduled on: Thurs. 07/10/16 Report to Day Surgery. To find out your arrival time please call [phone] between 1PM - 3PM on Wed. 07/09/16.  Remember: Instructions that are not followed completely may result in serious medical risk, up to and including death, or upon the discretion of your surgeon and anesthesiologist your surgery may need to be rescheduled.    _x___ 1. Do not eat food or drink liquids after midnight. No gum chewing or hard candies.     _x___ 2. No Alcohol for 24 hours before or after surgery.   __x__ 3. Do Not Smoke For 24 Hours Prior to Your Surgery.   ____ 4. Bring all medications with you on the day of surgery if instructed.    __x__ 5. Notify your doctor if there is any change in your medical condition     (cold, fever, infections).       Do not wear jewelry, make-up, hairpins, clips or nail polish.  Do not wear lotions, powders, or perfumes. You may wear deodorant.  Do not shave 48 hours prior to surgery. Men may shave face and neck.  Do not bring valuables to the hospital.    [facility] any belongings or valuables.               Contacts, dentures or bridgework may not be worn into surgery.  Leave your suitcase in the car. After surgery it may be brought to your room.  For patients admitted to the hospital, discharge time is determined by your                treatment team.   Patients discharged the day of surgery will not be allowed to drive home.   Please read over the following fact sheets that you were given:      _x___ Take these medicines the morning of surgery with A SIP OF WATER:    1. busPIRone (BUSPAR) 15 MG tablet  2.oxymetazoline (AFRIN) 0.05 % nasal spray   3.   4.  5.  6.  ____ Fleet Enema (as directed)   _x___ Use CHG Soap as directed  ____ Use inhalers on the day of surgery  ____ Stop metformin 2 days prior to surgery    ____ Take 1/2 of usual insulin dose the night before surgery  and none on the morning of surgery.   __ x _   Reduce aspirin to 81mg  3 days prior to surgery 07/07/16 __x__ Stop Anti-inflammatories ibuprofen (ADVIL,MOTRIN) 200 MG tablet  on today  Tylenol for pain   ____ Stop supplements until after surgery.    ____ Bring C-Pap to the hospital.

## 2016-07-09 MED ORDER — CEFAZOLIN SODIUM-DEXTROSE 2-4 GM/100ML-% IV SOLN
2.0000 g | INTRAVENOUS | Status: AC
  Administered 2016-07-10: 2 g via INTRAVENOUS

## 2016-07-10 ENCOUNTER — Ambulatory Visit
Admission: RE | Admit: 2016-07-10 | Discharge: 2016-07-10 | Disposition: A | Discharge status: Home / Self Care | Payer: Medicare HMO | Source: Ambulatory Visit | Attending: General Surgery | Admitting: General Surgery

## 2016-07-10 ENCOUNTER — Ambulatory Visit: Payer: Medicare HMO | Admitting: Registered Nurse

## 2016-07-10 ENCOUNTER — Encounter
Admission: RE | Disposition: A | Discharge status: Home / Self Care | Payer: Self-pay | Source: Ambulatory Visit | Attending: General Surgery

## 2016-07-10 ENCOUNTER — Ambulatory Visit: Payer: Medicare HMO

## 2016-07-10 ENCOUNTER — Encounter: Payer: Self-pay | Admitting: *Deleted

## 2016-07-10 DIAGNOSIS — I69354 Hemiplegia and hemiparesis following cerebral infarction affecting left non-dominant side: Secondary | ICD-10-CM | POA: Diagnosis not present

## 2016-07-10 DIAGNOSIS — E785 Hyperlipidemia, unspecified: Secondary | ICD-10-CM | POA: Insufficient documentation

## 2016-07-10 DIAGNOSIS — F419 Anxiety disorder, unspecified: Secondary | ICD-10-CM | POA: Insufficient documentation

## 2016-07-10 DIAGNOSIS — F17210 Nicotine dependence, cigarettes, uncomplicated: Secondary | ICD-10-CM | POA: Insufficient documentation

## 2016-07-10 DIAGNOSIS — R569 Unspecified convulsions: Secondary | ICD-10-CM | POA: Diagnosis not present

## 2016-07-10 DIAGNOSIS — Z7982 Long term (current) use of aspirin: Secondary | ICD-10-CM | POA: Insufficient documentation

## 2016-07-10 DIAGNOSIS — K8010 Calculus of gallbladder with chronic cholecystitis without obstruction: Secondary | ICD-10-CM | POA: Diagnosis not present

## 2016-07-10 DIAGNOSIS — K8020 Calculus of gallbladder without cholecystitis without obstruction: Secondary | ICD-10-CM | POA: Diagnosis present

## 2016-07-10 DIAGNOSIS — F329 Major depressive disorder, single episode, unspecified: Secondary | ICD-10-CM | POA: Insufficient documentation

## 2016-07-10 DIAGNOSIS — Z882 Allergy status to sulfonamides status: Secondary | ICD-10-CM | POA: Diagnosis not present

## 2016-07-10 DIAGNOSIS — K828 Other specified diseases of gallbladder: Secondary | ICD-10-CM

## 2016-07-10 DIAGNOSIS — Z419 Encounter for procedure for purposes other than remedying health state, unspecified: Secondary | ICD-10-CM

## 2016-07-10 HISTORY — PX: CHOLECYSTECTOMY: SHX55

## 2016-07-10 LAB — URINE DRUG SCREEN, QUALITATIVE (ARMC ONLY)
AMPHETAMINES, UR SCREEN: NOT DETECTED
BENZODIAZEPINE, UR SCRN: NOT DETECTED
Barbiturates, Ur Screen: NOT DETECTED
CANNABINOID 50 NG, UR ~~LOC~~: POSITIVE — AB
Cocaine Metabolite,Ur ~~LOC~~: NOT DETECTED
MDMA (Ecstasy)Ur Screen: NOT DETECTED
Methadone Scn, Ur: NOT DETECTED
Opiate, Ur Screen: NOT DETECTED
Phencyclidine (PCP) Ur S: NOT DETECTED
TRICYCLIC, UR SCREEN: NOT DETECTED

## 2016-07-10 SURGERY — LAPAROSCOPIC CHOLECYSTECTOMY WITH INTRAOPERATIVE CHOLANGIOGRAM
Anesthesia: General | Site: Abdomen | Wound class: Clean Contaminated

## 2016-07-10 MED ORDER — ACETAMINOPHEN 10 MG/ML IV SOLN
INTRAVENOUS | Status: DC | PRN
Start: 2016-07-10 — End: 2016-07-10
  Administered 2016-07-10: 1000 mg via INTRAVENOUS

## 2016-07-10 MED ORDER — KETOROLAC TROMETHAMINE 30 MG/ML IJ SOLN
INTRAMUSCULAR | Status: AC
  Filled 2016-07-10: qty 1

## 2016-07-10 MED ORDER — FENTANYL CITRATE (PF) 100 MCG/2ML IJ SOLN
INTRAMUSCULAR | Status: DC | PRN
  Administered 2016-07-10 (×2): 50 ug via INTRAVENOUS
  Administered 2016-07-10: 100 ug via INTRAVENOUS

## 2016-07-10 MED ORDER — LIDOCAINE HCL (PF) 2 % IJ SOLN
INTRAMUSCULAR | Status: AC
  Filled 2016-07-10: qty 2

## 2016-07-10 MED ORDER — LIDOCAINE HCL (CARDIAC) 20 MG/ML IV SOLN
INTRAVENOUS | Status: DC | PRN
  Administered 2016-07-10: 80 mg via INTRAVENOUS

## 2016-07-10 MED ORDER — ONDANSETRON HCL 4 MG/2ML IJ SOLN: 4.0000 mg | Freq: Once | INTRAMUSCULAR | Status: DC | PRN

## 2016-07-10 MED ORDER — FENTANYL CITRATE (PF) 100 MCG/2ML IJ SOLN
INTRAMUSCULAR | Status: AC
Start: 2016-07-10 — End: 2016-07-10
  Filled 2016-07-10: qty 2

## 2016-07-10 MED ORDER — LACTATED RINGERS IR SOLN
Status: DC | PRN
  Administered 2016-07-10: 200 mL

## 2016-07-10 MED ORDER — PROPOFOL 10 MG/ML IV BOLUS
INTRAVENOUS | Status: AC
  Filled 2016-07-10: qty 20

## 2016-07-10 MED ORDER — KETOROLAC TROMETHAMINE 30 MG/ML IJ SOLN
INTRAMUSCULAR | Status: DC | PRN
  Administered 2016-07-10: 30 mg via INTRAVENOUS

## 2016-07-10 MED ORDER — SODIUM CHLORIDE 0.9 % IJ SOLN
INTRAMUSCULAR | Status: AC
  Filled 2016-07-10: qty 50

## 2016-07-10 MED ORDER — ACETAMINOPHEN 10 MG/ML IV SOLN
INTRAVENOUS | Status: AC
  Filled 2016-07-10: qty 100

## 2016-07-10 MED ORDER — MIDAZOLAM HCL 2 MG/2ML IJ SOLN
INTRAMUSCULAR | Status: DC | PRN
  Administered 2016-07-10: 2 mg via INTRAVENOUS

## 2016-07-10 MED ORDER — OXYCODONE-ACETAMINOPHEN 5-325 MG PO TABS: 1.0000 | ORAL_TABLET | ORAL | 0 refills | Status: DC | PRN

## 2016-07-10 MED ORDER — ONDANSETRON HCL 4 MG/2ML IJ SOLN
INTRAMUSCULAR | Status: AC
  Filled 2016-07-10: qty 2

## 2016-07-10 MED ORDER — CHLORHEXIDINE GLUCONATE CLOTH 2 % EX PADS: 6.0000 | MEDICATED_PAD | Freq: Once | CUTANEOUS | Status: DC

## 2016-07-10 MED ORDER — BUPIVACAINE HCL (PF) 0.5 % IJ SOLN
INTRAMUSCULAR | Status: DC | PRN
  Administered 2016-07-10: 20 mL

## 2016-07-10 MED ORDER — FENTANYL CITRATE (PF) 100 MCG/2ML IJ SOLN
INTRAMUSCULAR | Status: AC
  Filled 2016-07-10: qty 2

## 2016-07-10 MED ORDER — LACTATED RINGERS IV SOLN
INTRAVENOUS | Status: DC
  Administered 2016-07-10: 09:00:00 via INTRAVENOUS

## 2016-07-10 MED ORDER — MIDAZOLAM HCL 2 MG/2ML IJ SOLN
INTRAMUSCULAR | Status: AC
  Filled 2016-07-10: qty 2

## 2016-07-10 MED ORDER — FAMOTIDINE 20 MG PO TABS
20.0000 mg | ORAL_TABLET | Freq: Once | ORAL | Status: AC
  Administered 2016-07-10: 20 mg via ORAL

## 2016-07-10 MED ORDER — FENTANYL CITRATE (PF) 100 MCG/2ML IJ SOLN: 25.0000 ug | INTRAMUSCULAR | Status: DC | PRN

## 2016-07-10 MED ORDER — ONDANSETRON HCL 4 MG/2ML IJ SOLN
INTRAMUSCULAR | Status: DC | PRN
  Administered 2016-07-10: 4 mg via INTRAVENOUS

## 2016-07-10 MED ORDER — PROPOFOL 10 MG/ML IV BOLUS
INTRAVENOUS | Status: DC | PRN
  Administered 2016-07-10: 130 mg via INTRAVENOUS

## 2016-07-10 MED ORDER — DEXAMETHASONE SODIUM PHOSPHATE 10 MG/ML IJ SOLN
INTRAMUSCULAR | Status: DC | PRN
Start: 2016-07-10 — End: 2016-07-10
  Administered 2016-07-10: 10 mg via INTRAVENOUS

## 2016-07-10 MED ORDER — DEXAMETHASONE SODIUM PHOSPHATE 10 MG/ML IJ SOLN
INTRAMUSCULAR | Status: AC
  Filled 2016-07-10: qty 1

## 2016-07-10 MED ORDER — FAMOTIDINE 20 MG PO TABS
ORAL_TABLET | ORAL | Status: AC
  Administered 2016-07-10: 20 mg via ORAL
  Filled 2016-07-10: qty 1

## 2016-07-10 MED ORDER — ROCURONIUM BROMIDE 50 MG/5ML IV SOSY
PREFILLED_SYRINGE | INTRAVENOUS | Status: AC
  Filled 2016-07-10: qty 5

## 2016-07-10 MED ORDER — ROCURONIUM BROMIDE 100 MG/10ML IV SOLN
INTRAVENOUS | Status: DC | PRN
  Administered 2016-07-10: 30 mg via INTRAVENOUS
  Administered 2016-07-10: 20 mg via INTRAVENOUS
  Administered 2016-07-10: 50 mg via INTRAVENOUS

## 2016-07-10 MED ORDER — SUGAMMADEX SODIUM 200 MG/2ML IV SOLN
INTRAVENOUS | Status: AC
  Filled 2016-07-10: qty 2

## 2016-07-10 MED ORDER — SODIUM CHLORIDE 0.9 % IV SOLN
INTRAVENOUS | Status: DC | PRN
  Administered 2016-07-10: 6 mL

## 2016-07-10 MED ORDER — BUPIVACAINE HCL (PF) 0.5 % IJ SOLN
INTRAMUSCULAR | Status: AC
  Filled 2016-07-10: qty 30

## 2016-07-10 MED ORDER — SUGAMMADEX SODIUM 200 MG/2ML IV SOLN
INTRAVENOUS | Status: DC | PRN
Start: 2016-07-10 — End: 2016-07-10
  Administered 2016-07-10: 150 mg via INTRAVENOUS

## 2016-07-10 MED ORDER — CEFAZOLIN SODIUM-DEXTROSE 2-4 GM/100ML-% IV SOLN
INTRAVENOUS | Status: AC
  Administered 2016-07-10: 2 g via INTRAVENOUS
  Filled 2016-07-10: qty 100

## 2016-07-10 SURGICAL SUPPLY — 49 items
AGENT HMST MTR 8 SURGIFLO (HEMOSTASIS)
ANCHOR TIS RET SYS 235ML (MISCELLANEOUS) ×3 IMPLANT
APPLICATOR SURGIFLO (MISCELLANEOUS) IMPLANT
APPLIER CLIP LOGIC TI 5 (MISCELLANEOUS) ×3 IMPLANT
APR CLP MED LRG 33X5 (MISCELLANEOUS) ×1
BAG TISS RTRVL C235 10X14 (MISCELLANEOUS) ×1
BLADE SURG 11 STRL SS SAFETY (MISCELLANEOUS) ×3 IMPLANT
CANISTER SUCT 1200ML W/VALVE (MISCELLANEOUS) ×3 IMPLANT
CANNULA DILATOR 10 W/SLV (CANNULA) ×2 IMPLANT
CANNULA DILATOR 10MM W/SLV (CANNULA) ×1
CATH CHOLANG 76X19 KUMAR (CATHETERS) ×3 IMPLANT
CATH REDDICK CHOLANGI 4FR 50CM (CATHETERS) ×3 IMPLANT
CHLORAPREP W/TINT 26ML (MISCELLANEOUS) ×3 IMPLANT
CLOSURE WOUND 1/2 X4 (GAUZE/BANDAGES/DRESSINGS) ×1
DECANTER SPIKE VIAL GLASS SM (MISCELLANEOUS) ×3 IMPLANT
DEFOGGER SCOPE WARMER CLEARIFY (MISCELLANEOUS) ×3 IMPLANT
DRAPE C-ARM XRAY 36X54 (DRAPES) ×3 IMPLANT
DRAPE INCISE IOBAN 66X45 STRL (DRAPES) ×3 IMPLANT
DRESSING TELFA 4X3 1S ST N-ADH (GAUZE/BANDAGES/DRESSINGS) ×3 IMPLANT
DRSG TEGADERM 2-3/8X2-3/4 SM (GAUZE/BANDAGES/DRESSINGS) ×12 IMPLANT
ELECT REM PT RETURN 9FT ADLT (ELECTROSURGICAL) ×3
ELECTRODE REM PT RTRN 9FT ADLT (ELECTROSURGICAL) ×1 IMPLANT
GLOVE BIO SURGEON STRL SZ7 (GLOVE) ×3 IMPLANT
GOWN STRL REUS W/ TWL LRG LVL3 (GOWN DISPOSABLE) ×3 IMPLANT
GOWN STRL REUS W/TWL LRG LVL3 (GOWN DISPOSABLE) ×9
GRASPER SUT TROCAR 14GX15 (MISCELLANEOUS) ×3 IMPLANT
HEMOSTAT SURGICEL 2X3 (HEMOSTASIS) IMPLANT
IRRIGATION STRYKERFLOW (MISCELLANEOUS) ×1 IMPLANT
IRRIGATOR STRYKERFLOW (MISCELLANEOUS) ×3
IV LACTATED RINGERS 1000ML (IV SOLUTION) ×3 IMPLANT
KIT RM TURNOVER STRD PROC AR (KITS) ×3 IMPLANT
LABEL OR SOLS (LABEL) ×3 IMPLANT
NDL INSUFF ACCESS 14 VERSASTEP (NEEDLE) ×3 IMPLANT
NEEDLE HYPO 25X1 1.5 SAFETY (NEEDLE) ×3 IMPLANT
PACK LAP CHOLECYSTECTOMY (MISCELLANEOUS) ×3 IMPLANT
SCISSORS METZENBAUM CVD 33 (INSTRUMENTS) ×3 IMPLANT
SLEEVE ENDOPATH XCEL 5M (ENDOMECHANICALS) ×6 IMPLANT
SPOGE SURGIFLO 8M (HEMOSTASIS)
SPONGE SURGIFLO 8M (HEMOSTASIS) IMPLANT
STRIP CLOSURE SKIN 1/2X4 (GAUZE/BANDAGES/DRESSINGS) ×2 IMPLANT
SUT PDS AB 0 CT1 27 (SUTURE) ×3 IMPLANT
SUT VIC AB 0 SH 27 (SUTURE) ×3 IMPLANT
SUT VIC AB 3-0 SH 27 (SUTURE) ×2
SUT VIC AB 3-0 SH 27X BRD (SUTURE) ×1 IMPLANT
SUT VIC AB 4-0 FS2 27 (SUTURE) ×3 IMPLANT
SWABSTK COMLB BENZOIN TINCTURE (MISCELLANEOUS) ×3 IMPLANT
SYR 10ML LL (SYRINGE) ×3 IMPLANT
TROCAR XCEL NON-BLD 5MMX100MML (ENDOMECHANICALS) ×3 IMPLANT
TUBING INSUFFLATOR HI FLOW (MISCELLANEOUS) ×3 IMPLANT

## 2016-07-10 NOTE — Anesthesia Post-op Follow-up Note (Cosign Needed)
Anesthesia QCDR form completed.        

## 2016-07-10 NOTE — Op Note (Signed)
Preop diagnosis: Cholelithiasis and porcelain gallbladder  Post op diagnosis: Same  Operation: Laparoscopy cholecystectomy and intraoperative cholangiogram  Surgeon: Kathreen CosierS. G. Rozina Pointer  Assistant:     Anesthesia: Gen.  Complications: None  EBL: Less than 20 mL  Drains: None  Description: Patient was put to sleep in the supine position the operating table in the abdomen was prepped and draped as sterile field. Timeout was performed. Initial port incision was made a little below the umbilicus given the small abdominal area. The Veress needle with the InnerDyne sleeve was position in the peritoneal cavity verified of the hanging drop method. Pneumoperitoneum was obtained followed by placement of a 10 mm port. The camera in place epigastric and 2 lateral 5 mm ports were placed. The right upper quadrant and the gallbladder were then adequately visualized the operating liver being somewhat darker in color but otherwise the with no enlargement or nodularity. The gallbladder was somewhat neck contracted state with the Heavily calcified area in the mid portion extending down to the cystic duct region. There was some adhesions tenting up the duodenum. With careful traction and exposure the adhesions were taken down until the area of the cystic duct and common duct were  Identified. The common duct appeared to be normal there appeared to be a short cystic duct for about a centimeter or so and above that the gallbladder was filled with this hard stone extending all the way up to the towards the fundus. With careful exposure and dissection the cystic duct was freed it was then proximally hemoclipped. Small opening was made with some clear bile seen coming through. Cholangiogram was then completed with a Reddick catheter. This showed good filling of the duct both proximal and distal with no evidence of stones or filling defects and no obstruction to flow. The catheter was then removed. The cystic duct was hemoclipped  and cut. The cystic artery was identified freed hemoclipped and cut. Gallbladder was dissected free from its bed using cautery for control of bleeding. Area was irrigated with small amount of fluid in these were suctioned out. A 5 mm scope was then utilized in the epigastric port site and the gallbladder was brought up with with a retrieval bag through the umbilical port site. Given the calcified the gallbladder wall with large stone the skin and the fascial incision was extended slightly and subsequently the gallbladder removed satisfactorily in full. Following this the upper quadrant once again inspected and after ensuring hemostasis in stable and good positioning of the clips remaining ports are removed. The fascial opening of the umbilical port site incision was closed with the 2 figure-of-eight stitches of 0 PDS. Subcutaneous tissue tissue closed with 3-0 Vicryl. All the skin incisions were closed with subcuticular 4-0 Vicryl reinforced with Steri-Strips and tincture benzoin. Telfa and Tegaderm dressings were placed. 20 mL of 0.5% Marcaine was instilled all around the multiple port sites for postop analgesia. Patient subsequently extubated and returned recovery room stable condition

## 2016-07-10 NOTE — Anesthesia Procedure Notes (Signed)
Procedure Name: Intubation Date/Time: 07/10/2016 9:00 AM Performed by: Doreen Salvage Pre-anesthesia Checklist: Patient identified, Patient being monitored, Timeout performed, Emergency Drugs available and Suction available Patient Re-evaluated:Patient Re-evaluated prior to inductionOxygen Delivery Method: Circle system utilized Preoxygenation: Pre-oxygenation with 100% oxygen Intubation Type: IV induction Ventilation: Mask ventilation without difficulty Laryngoscope Size: Mac and 3 Grade View: Grade I Tube type: Oral Tube size: 7.0 mm Number of attempts: 1 Airway Equipment and Method: Stylet Placement Confirmation: ETT inserted through vocal cords under direct vision,  positive ETCO2 and breath sounds checked- equal and bilateral Secured at: 21 cm Tube secured with: Tape Dental Injury: Teeth and Oropharynx as per pre-operative assessment

## 2016-07-10 NOTE — Discharge Instructions (Signed)
  AMBULATORY SURGERY  DISCHARGE INSTRUCTIONS   1) The drugs that you were given will stay in your system until tomorrow so for the next 24 hours you should not:  A) Drive an automobile B) Make any legal decisions C) Drink any alcoholic beverage   2) You may resume regular meals tomorrow.  Today it is better to start with liquids and gradually work up to solid foods.  You may eat anything you prefer, but it is better to start with liquids, then soup and crackers, and gradually work up to solid foods.   3) Please notify your doctor immediately if you have any unusual bleeding, trouble breathing, redness and pain at the surgery site, drainage, fever, or pain not relieved by medication.    4) Additional Instructions: TAKE A STOOL SOFTENER TWICE A DAY WHILE TAKING NARCOTIC PAIN MEDICINE TO PREVENT CONSTIPATION   Please contact your physician with any problems or Same Day Surgery at [phone], Monday through Friday 6 am to 4 pm, or [place] at [place] Main number at [phone].   

## 2016-07-10 NOTE — Anesthesia Preprocedure Evaluation (Signed)
Anesthesia Evaluation  Patient identified by MRN, date of birth, ID band Patient awake    Reviewed: Allergy & Precautions, H&P , NPO status , Patient's Chart, lab work & pertinent test results, reviewed documented beta blocker date and time   Airway Mallampati: II  TM Distance: >3 FB Neck ROM: full    Dental  (+) Upper Dentures, Lower Dentures   Pulmonary neg pulmonary ROS, Current Smoker,    Pulmonary exam normal        Cardiovascular negative cardio ROS Normal cardiovascular exam Rhythm:regular Rate:Normal     Neuro/Psych Seizures -, Well Controlled,  Left side weakness CVA, Residual Symptoms negative neurological ROS  negative psych ROS   GI/Hepatic negative GI ROS, Neg liver ROS,   Endo/Other  negative endocrine ROS  Renal/GU negative Renal ROS  negative genitourinary   Musculoskeletal   Abdominal   Peds  Hematology negative hematology ROS (+)   Anesthesia Other Findings Past Medical History: No date: Anxiety No date: Chronic major depressive disorder No date: Dysuria No date: Heavy smoker No date: History of TIA (transient ischemic attack) No date: Hyperlipidemia, acquired No date: Seizures ([facility])     Comment: r/t to stroke 11/16/2006: Stroke [facility])     Comment: left sided weakness No date: Urinary incontinence in female Past Surgical History: 05/13/2016: COLONOSCOPY WITH PROPOFOL N/A     Comment: Procedure: COLONOSCOPY WITH PROPOFOL;                Surgeon: Kieth BrightlySeeplaputhur G Sankar, MD;  Location:               [facility];  Service: Endoscopy;                Laterality: N/A; 05/21/2016: COLONOSCOPY WITH PROPOFOL N/A     Comment: Procedure: COLONOSCOPY WITH PROPOFOL;                Surgeon: Kieth BrightlySeeplaputhur G Sankar, MD;  Location:               [facility];  Service: Endoscopy;                Laterality: N/A; No date: TUBAL LIGATION   Reproductive/Obstetrics negative OB ROS                              Anesthesia Physical Anesthesia Plan  ASA: III  Anesthesia Plan: General ETT   Post-op Pain Management:    Induction:   Airway Management Planned:   Additional Equipment:   Intra-op Plan:   Post-operative Plan:   Informed Consent: I have reviewed the patients History and Physical, chart, labs and discussed the procedure including the risks, benefits and alternatives for the proposed anesthesia with the patient or authorized representative who has indicated his/her understanding and acceptance.   Dental Advisory Given  Plan Discussed with: CRNA  Anesthesia Plan Comments:         Anesthesia Quick Evaluation

## 2016-07-10 NOTE — H&P (View-Only) (Signed)
Patient ID: Kim Gallagher, female   DOB: 01/08/1966, 51 y.o.   MRN: [id number]  Chief Complaint  Patient presents with  . Follow-up    HPI Kim Gallagher is a 51 y.o. female here today for her follow up colonoscopy which was done on 05/13/2016. Patient states she is doing well. Patient is also here today to discuss gallbladder removal.  I have reviewed the history of present illness with the patient.  HPI  Past Medical History:  Diagnosis Date  . Anxiety   . Chronic major depressive disorder   . Dysuria   . Heavy smoker   . History of TIA (transient ischemic attack)   . Hyperlipidemia, acquired   . Seizures ([facility])   . Stroke ([facility]) 11/16/2006  . Urinary incontinence in female     Past Surgical History:  Procedure Laterality Date  . COLONOSCOPY WITH PROPOFOL N/A 05/13/2016   Procedure: COLONOSCOPY WITH PROPOFOL;  Surgeon: Seeplaputhur G Sankar, MD;  Location: [facility];  Service: Endoscopy;  Laterality: N/A;  . COLONOSCOPY WITH PROPOFOL N/A 05/21/2016   Procedure: COLONOSCOPY WITH PROPOFOL;  Surgeon: Seeplaputhur G Sankar, MD;  Location: [facility];  Service: Endoscopy;  Laterality: N/A;  . TUBAL LIGATION      Family History  Problem Relation Age of Onset  . Kidney disease Mother   . Hypertension Mother   . Depression Mother   . Diabetes Mother   . Stroke Father   . Hypertension Father   . Cancer Father     thyroid  . Diabetes Father   . Diabetes Sister   . Diabetes Brother     Social History Social History  Substance Use Topics  . Smoking status: Current Every Day Smoker    Packs/day: 1.00    Types: Cigarettes  . Smokeless tobacco: Never Used  . Alcohol use No    Allergies  Allergen Reactions  . Sulfa Antibiotics Rash    Current Outpatient Prescriptions  Medication Sig Dispense Refill  . aspirin 325 MG tablet Take 325 mg by mouth daily.    . FLUoxetine HCl 60 MG TABS Take 1 tablet by mouth daily. 30 tablet 5  . mirtazapine (REMERON) 15 MG tablet  Take 0.5 tablets (7.5 mg total) by mouth at bedtime. For sleep and to help with weight gain 15 tablet 2  . phenytoin (DILANTIN) 100 MG ER capsule Take 300 mg by mouth at bedtime.    . Vitamin D, Ergocalciferol, (DRISDOL) 50000 units CAPS capsule Take 1 capsule (50,000 Units total) by mouth every 7 (seven) days. 4 capsule 1   No current facility-administered medications for this visit.     Review of Systems Review of Systems  Constitutional: Negative.   Respiratory: Negative.   Cardiovascular: Negative.     Blood pressure 120/80, pulse 87, resp. rate 12, height 5' 2" (1.575 m), weight 112 lb (50.8 kg).  Physical Exam Physical Exam  Constitutional: She is oriented to person, place, and time. She appears well-developed and well-nourished.  Eyes: Conjunctivae are normal. No scleral icterus.  Neck: Neck supple.  Cardiovascular: Normal rate, regular rhythm and normal heart sounds.   Pulmonary/Chest: Effort normal and breath sounds normal.  Abdominal: Soft. Bowel sounds are normal. There is no tenderness. There is negative Murphy's sign.  Lymphadenopathy:    She has no cervical adenopathy.  Neurological: She is alert and oriented to person, place, and time.  Skin: Skin is warm and dry.    Data Reviewed Notes and imaging reviewed. Colonoscopy was   normal. CT  prior had suggested some thickening associated with proximal transverse colon Assessment    Calcification of the gallbladder wall- associated risk of cancer Cholelithiasis Due to calcification of the gallbladder wall, unexplained weight loss, and loss of appetite, will proceed with laparoscopic cholecystectomy.    Plan   Pt is agreeable to cholecystectomy    Laparoscopic Cholecystectomy with Intraoperative Cholangiogram. The procedure, including it's potential risks and complications (including but not limited to infection, bleeding, injury to intra-abdominal organs or bile ducts, bile leak, poor cosmetic result, sepsis and  death) were discussed with the patient in detail. Non-operative options, including their inherent risks (acute calculous cholecystitis with possible choledocholithiasis or gallstone pancreatitis, with the risk of ascending cholangitis, sepsis, and death) were discussed as well. The patient expressed and understanding of what we discussed and wishes to proceed with laparoscopic cholecystectomy. The patient further understands that if it is technically not possible, or it is unsafe to proceed laparoscopically, that I will convert to an open cholecystectomy.  Procedure and risks explained to patient.   Requested that patient not smoke at least on the day of the surgery.   Will write prescription for nicotine patch. Ideally, we would like patient to start this 7 days prior to surgery and continue for one week after surgery.   Patient's surgery has been scheduled for 07-10-16 at [facility]. This patient will decrease from a 325 mg aspirin to an 81 mg aspirin 3 days prior to surgery.   This information has been scribed by Jessica Qualls CMA.   SANKAR,SEEPLAPUTHUR G 06/13/2016, 4:37 PM   

## 2016-07-10 NOTE — Interval H&P Note (Signed)
History and Physical Interval Note:  07/10/2016 8:08 AM  Kim Gallagher  has presented today for surgery, with the diagnosis of CHOLILITHIASIS  The various methods of treatment have been discussed with the patient and family. After consideration of risks, benefits and other options for treatment, the patient has consented to  Procedure(s): LAPAROSCOPIC CHOLECYSTECTOMY WITH INTRAOPERATIVE CHOLANGIOGRAM (N/A) as a surgical intervention .  The patient's history has been reviewed, patient examined, no change in status, stable for surgery.  I have reviewed the patient's chart and labs.  Questions were answered to the patient's satisfaction.     SANKAR,SEEPLAPUTHUR G

## 2016-07-10 NOTE — Transfer of Care (Signed)
Immediate Anesthesia Transfer of Care Note  Patient: Kim Gallagher  Procedure(s) Performed: Procedure(s): LAPAROSCOPIC CHOLECYSTECTOMY WITH INTRAOPERATIVE CHOLANGIOGRAM (N/A)  Patient Location: PACU  Anesthesia Type:General  Level of Consciousness: sedated  Airway & Oxygen Therapy: Patient Spontanous Breathing and Patient connected to face mask oxygen  Post-op Assessment: Report given to RN and Post -op Vital signs reviewed and stable  Post vital signs: Reviewed and stable  Last Vitals:  Vitals:   07/10/16 1044 07/10/16 1049  BP:  107/61  Pulse: 93 95  Resp: 18 16  Temp:      Complications: No apparent anesthesia complications

## 2016-07-11 ENCOUNTER — Inpatient Hospital Stay: Payer: Medicare HMO | Admitting: Hematology and Oncology

## 2016-07-11 LAB — SURGICAL PATHOLOGY

## 2016-07-12 NOTE — Anesthesia Postprocedure Evaluation (Signed)
Anesthesia Post Note  Patient: Kim CorneaLisa C Gallagher  Procedure(s) Performed: Procedure(s) (LRB): LAPAROSCOPIC CHOLECYSTECTOMY WITH INTRAOPERATIVE CHOLANGIOGRAM (N/A)  Patient location during evaluation: PACU Anesthesia Type: General Level of consciousness: awake and alert Pain management: pain level controlled Vital Signs Assessment: post-procedure vital signs reviewed and stable Respiratory status: spontaneous breathing, nonlabored ventilation, respiratory function stable and patient connected to nasal cannula oxygen Cardiovascular status: blood pressure returned to baseline and stable Postop Assessment: no signs of nausea or vomiting Anesthetic complications: no     Last Vitals:  Vitals:   07/10/16 1200 07/10/16 1224  BP: 116/65 104/83  Pulse: 86 88  Resp:    Temp:      Last Pain:  Vitals:   07/10/16 1224  TempSrc:   PainSc: 2                  Yevette EdwardsJames G Adams

## 2016-07-13 NOTE — Assessment & Plan Note (Signed)
Patient to see psychiatrist, as other than malignancy work-up, psychiatric reasons are in ddx for her weight loss

## 2016-07-13 NOTE — Assessment & Plan Note (Signed)
Noted on CT scan, transverse colon; managed, evaluated by surgeon; s/p colonoscopy

## 2016-07-13 NOTE — Assessment & Plan Note (Signed)
Patient does not have any desire to quit smoking

## 2016-07-13 NOTE — Assessment & Plan Note (Signed)
I explained that I am not willing to prescribe her benzo as requested if she is going to continue to smoke marijuana

## 2016-07-13 NOTE — Assessment & Plan Note (Signed)
This appears to have stabilized; patient and I discussed ddx, including malignancy, lack of nutrition, stress; her thyroid testing was normal

## 2016-07-13 NOTE — Assessment & Plan Note (Signed)
Patient to see GYN for evaluation

## 2016-07-13 NOTE — Assessment & Plan Note (Signed)
Negative mammogram on 06/20/2016

## 2016-07-13 NOTE — Assessment & Plan Note (Signed)
Encouraged patient to pick up the Rx vitamin D which should bring her level up much more quickly than OTC supplements; she agrees

## 2016-07-13 NOTE — Assessment & Plan Note (Signed)
Due to have cholecystectomy next week

## 2016-07-17 ENCOUNTER — Ambulatory Visit: Payer: Medicare HMO | Admitting: General Surgery

## 2016-07-18 ENCOUNTER — Inpatient Hospital Stay: Payer: Medicare HMO | Attending: Hematology and Oncology | Admitting: Hematology and Oncology

## 2016-08-12 ENCOUNTER — Ambulatory Visit: Payer: Self-pay | Admitting: Family Medicine

## 2016-08-14 ENCOUNTER — Ambulatory Visit: Payer: Self-pay | Admitting: Family Medicine

## 2016-08-19 ENCOUNTER — Ambulatory Visit: Payer: Self-pay | Admitting: Family Medicine

## 2016-08-25 ENCOUNTER — Other Ambulatory Visit: Payer: Self-pay | Admitting: Family Medicine

## 2016-09-26 ENCOUNTER — Other Ambulatory Visit: Payer: Self-pay | Admitting: Family Medicine

## 2016-11-17 ENCOUNTER — Other Ambulatory Visit: Payer: Self-pay | Admitting: Family Medicine

## 2016-11-17 NOTE — Telephone Encounter (Signed)
LMOM to inform pt °

## 2016-11-17 NOTE — Telephone Encounter (Signed)
We had hoped to see the patient back in March for follow-up Please ask her to schedule an appointment I'll be glad to refill her medicine as requested Thank you, and we look forward to seeing her

## 2017-04-07 ENCOUNTER — Telehealth: Payer: Self-pay | Admitting: Family Medicine

## 2017-04-07 DIAGNOSIS — F419 Anxiety disorder, unspecified: Secondary | ICD-10-CM

## 2017-04-07 NOTE — Telephone Encounter (Signed)
Appt please I'll refill her medicine for one month

## 2017-04-07 NOTE — Telephone Encounter (Signed)
Pt already have appt scheduled for this month.

## 2017-04-09 ENCOUNTER — Telehealth: Payer: Self-pay

## 2017-04-09 NOTE — Telephone Encounter (Signed)
I don't prescribe that for her She needs to get that from her neurologist Thank you

## 2017-04-09 NOTE — Telephone Encounter (Signed)
Pharmacy states that pt requested they send rx refill for Phenytoin sodium 100mg  #90 take 3 capsules by mouth at bedtime. Last fill 02/03/2017. Please advise.

## 2017-04-09 NOTE — Telephone Encounter (Signed)
Called pt informed her that she needs to get medication from neurologist. Pt gave verbal understanding.

## 2017-04-16 ENCOUNTER — Ambulatory Visit: Payer: Self-pay | Admitting: Family Medicine

## 2017-05-07 ENCOUNTER — Ambulatory Visit (INDEPENDENT_AMBULATORY_CARE_PROVIDER_SITE_OTHER): Payer: Medicare Other | Admitting: Family Medicine

## 2017-05-07 ENCOUNTER — Encounter: Payer: Self-pay | Admitting: Family Medicine

## 2017-05-07 DIAGNOSIS — E559 Vitamin D deficiency, unspecified: Secondary | ICD-10-CM | POA: Diagnosis not present

## 2017-05-07 DIAGNOSIS — F3341 Major depressive disorder, recurrent, in partial remission: Secondary | ICD-10-CM | POA: Diagnosis not present

## 2017-05-07 DIAGNOSIS — F1290 Cannabis use, unspecified, uncomplicated: Secondary | ICD-10-CM | POA: Diagnosis not present

## 2017-05-07 DIAGNOSIS — E785 Hyperlipidemia, unspecified: Secondary | ICD-10-CM | POA: Diagnosis not present

## 2017-05-07 DIAGNOSIS — F418 Other specified anxiety disorders: Secondary | ICD-10-CM

## 2017-05-07 DIAGNOSIS — R300 Dysuria: Secondary | ICD-10-CM | POA: Insufficient documentation

## 2017-05-07 DIAGNOSIS — G40909 Epilepsy, unspecified, not intractable, without status epilepticus: Secondary | ICD-10-CM | POA: Diagnosis not present

## 2017-05-07 DIAGNOSIS — Z5181 Encounter for therapeutic drug level monitoring: Secondary | ICD-10-CM

## 2017-05-07 DIAGNOSIS — R32 Unspecified urinary incontinence: Secondary | ICD-10-CM | POA: Insufficient documentation

## 2017-05-07 DIAGNOSIS — F419 Anxiety disorder, unspecified: Secondary | ICD-10-CM | POA: Diagnosis not present

## 2017-05-07 DIAGNOSIS — O039 Complete or unspecified spontaneous abortion without complication: Secondary | ICD-10-CM | POA: Insufficient documentation

## 2017-05-07 DIAGNOSIS — Z87898 Personal history of other specified conditions: Secondary | ICD-10-CM | POA: Insufficient documentation

## 2017-05-07 DIAGNOSIS — I639 Cerebral infarction, unspecified: Secondary | ICD-10-CM

## 2017-05-07 DIAGNOSIS — Z789 Other specified health status: Secondary | ICD-10-CM | POA: Insufficient documentation

## 2017-05-07 DIAGNOSIS — F339 Major depressive disorder, recurrent, unspecified: Secondary | ICD-10-CM | POA: Insufficient documentation

## 2017-05-07 MED ORDER — FLUOXETINE HCL 20 MG PO CAPS: 60.0000 mg | ORAL_CAPSULE | Freq: Every day | ORAL | 5 refills | Status: DC

## 2017-05-07 NOTE — Assessment & Plan Note (Signed)
Monitored by neurologist 

## 2017-05-07 NOTE — Assessment & Plan Note (Addendum)
Patient reports that her source is "safe"; she is not interested in quitting; she is aware that I will not prescribe any controlled substances to her whatsoever

## 2017-05-07 NOTE — Assessment & Plan Note (Signed)
Check lipids today 

## 2017-05-07 NOTE — Patient Instructions (Addendum)
Try to not eat more than 3 egg yolks per week Return in 6 months We'll contact you with the lab results from today

## 2017-05-07 NOTE — Assessment & Plan Note (Signed)
Check vit D level and supplement if needed 

## 2017-05-07 NOTE — Assessment & Plan Note (Signed)
No recent seizures; [facility]

## 2017-05-07 NOTE — Progress Notes (Signed)
BP 98/64 (BP Location: Left Arm, Patient Position: Sitting, Cuff Size: Normal)   Pulse 100   Temp 98.9 F (37.2 C) (Oral)   Ht 5\' 2"  (1.575 m)   Wt 135 lb 11.2 oz (61.6 kg)   SpO2 96%   BMI 24.82 kg/m    Subjective:    Patient ID: Kim Gallagher, female    DOB: May 01, 1966, 51 y.o.   MRN: [id number]  HPI: Kim Gallagher is a 51 y.o. female  Chief Complaint  Patient presents with  . Medication Refill    Pt states that she is doing much better, has her own place     HPI Patient is here for follow-up She just running out of medicines and needs refills She has her own place, gaining weight, eating better Stress level is much better She thinsk the prozac 60 mg daily is working well; no nausea, no headaches She is taking buspar 15 mg twice a day, every day thing She is seeing counselor, Felecia Janina Thompson, [facility] Not taking Xanax or oxycodone any more  Dilantin; last seizure was a long time ago; more than two years ago  High cholesterol; that runs in the family; eats eggs and cheese; on statin, no muscle aches  Low vitamin D; not much outdoor time  High potassium last check, never came back for recheck  Hx of stroke; taking 325 mg aspirin; no new sx of stroke, no noselbeeds or gum bleeding  Still smoking 1 ppd; not intersted in quitting; still smoking weed and has not plans to quit either of these habits  [facility] 05/07/2017 07/01/2016 06/10/2016 04/29/2016 03/18/2016  Decreased Interest 0 0 3 1 0  Down, Depressed, Hopeless 0 0 3 3 0  PHQ - 2 Score 0 0 6 4 0  Altered sleeping - 1 3 3  -  Tired, decreased energy - 1 3 3  -  Change in appetite - 1 3 3  -  Feeling bad or failure about yourself  - 1 3 3  -  Trouble concentrating - 1 3 3  -  Moving slowly or fidgety/restless - 1 3 0 -  Suicidal thoughts - 0 0 0 -  PHQ-9 Score - 6 24 19  -  Difficult doing work/chores - Not difficult at all Not difficult at all Somewhat difficult -    Relevant past  medical, surgical, family and social history reviewed Past Medical History:  Diagnosis Date  . Anxiety   . Chronic major depressive disorder   . Dysuria   . Heavy smoker   . History of TIA (transient ischemic attack)   . Hyperlipidemia, acquired   . Seizures (HCC)    r/t to stroke  . Stroke (HCC) 11/16/2006   left sided weakness  . Urinary incontinence in female    Past Surgical History:  Procedure Laterality Date  . CHOLECYSTECTOMY N/A 07/10/2016   Procedure: LAPAROSCOPIC CHOLECYSTECTOMY WITH INTRAOPERATIVE CHOLANGIOGRAM;  Surgeon: Kieth BrightlySeeplaputhur G Sankar, MD;  Location: [facility] ORS;  Service: General;  Laterality: N/A;  . COLONOSCOPY WITH PROPOFOL N/A 05/13/2016   Procedure: COLONOSCOPY WITH PROPOFOL;  Surgeon: Kieth BrightlySeeplaputhur G Sankar, MD;  Location: [facility] ENDOSCOPY;  Service: Endoscopy;  Laterality: N/A;  . COLONOSCOPY WITH PROPOFOL N/A 05/21/2016   Procedure: COLONOSCOPY WITH PROPOFOL;  Surgeon: Kieth BrightlySeeplaputhur G Sankar, MD;  Location: [facility] ENDOSCOPY;  Service: Endoscopy;  Laterality: N/A;  . TUBAL LIGATION     Family History  Problem Relation Age of Onset  . Kidney disease Mother   . Hypertension  Mother   . Depression Mother   . Diabetes Mother   . Stroke Father   . Hypertension Father   . Cancer Father        thyroid  . Diabetes Father   . Diabetes Sister   . Diabetes Brother    Social History   Tobacco Use  . Smoking status: Current Every Day Smoker    Packs/day: 1.00    Types: Cigarettes  . Smokeless tobacco: Never Used  Substance Use Topics  . Alcohol use: No    Alcohol/week: 0.0 oz  . Drug use: Yes    Frequency: 1.0 times per week    Types: Marijuana    Interim medical history since last visit reviewed. Allergies and medications reviewed  Review of Systems Per HPI unless specifically indicated above     Objective:    BP 98/64 (BP Location: Left Arm, Patient Position: Sitting, Cuff Size: Normal)   Pulse 100   Temp 98.9 F (37.2 C) (Oral)   Ht 5\' 2"   (1.575 m)   Wt 135 lb 11.2 oz (61.6 kg)   SpO2 96%   BMI 24.82 kg/m   Wt Readings from Last 3 Encounters:  05/07/17 135 lb 11.2 oz (61.6 kg)  07/03/16 113 lb (51.3 kg)  07/01/16 113 lb 5 oz (51.4 kg)    Physical Exam  Constitutional: She appears well-developed and well-nourished. No distress.  HENT:  Head: Normocephalic and atraumatic.  Eyes: EOM are normal. No scleral icterus.  Neck: No thyromegaly present.  Cardiovascular: Normal rate, regular rhythm and normal heart sounds.  No murmur heard. Pulmonary/Chest: Effort normal and breath sounds normal. She has no wheezes.  Abdominal: Soft. Bowel sounds are normal. She exhibits no distension.  Musculoskeletal: Normal range of motion. She exhibits no edema.  Neurological: She is alert. She exhibits normal muscle tone.  Skin: Skin is warm and dry. She is not diaphoretic. No pallor.  Skin appears aged beyond stated years; consistent with smoking  Psychiatric: She has a normal mood and affect. Her behavior is normal. Judgment and thought content normal. Her mood appears not anxious. She does not exhibit a depressed mood.  Good eye contact; much less anxious that previous visits       Assessment & Plan:   Problem List Items Addressed This Visit      Cardiovascular and Mediastinum   Stroke (HCC)    No new symptoms; continue full aspirin; continue statin        Nervous and Auditory   Seizure disorder (HCC)    No recent seizures; sees [facility] neurologist next week        Other   Anxiety   Relevant Medications   FLUoxetine (PROZAC) 20 MG capsule   Vitamin D deficiency    Check vit D level and supplement if needed      Relevant Orders   VITAMIN D 25 Hydroxy (Vit-D Deficiency, Fractures) (Completed)   Therapeutic drug monitoring    Monitored by neurologist      Relevant Orders   COMPLETE METABOLIC PANEL WITH GFR (Completed)   CBC with Differential/Platelet (Completed)   Major depressive disorder, recurrent  episode, in partial remission with anxious distress (HCC)    Continue prozac      Relevant Medications   FLUoxetine (PROZAC) 20 MG capsule   Other Relevant Orders   TSH (Completed)   Hyperlipidemia LDL goal <100    Check lipids today      Relevant Orders   Lipid panel (Completed)  Marijuana smoker    Patient reports that her source is "safe"; she is not interested in quitting; she is aware that I will not prescribe any controlled substances to her whatsoever          Follow up plan: Return in about 6 months (around 11/05/2017).  An after-visit summary was printed and given to the patient at check-out.  Please see the patient instructions which may contain other information and recommendations beyond what is mentioned above in the assessment and plan.  Meds ordered this encounter  Medications  . FLUoxetine (PROZAC) 20 MG capsule    Sig: Take 3 capsules (60 mg total) by mouth daily.    Dispense:  90 capsule    Refill:  5    Orders Placed This Encounter  Procedures  . Lipid panel  . COMPLETE METABOLIC PANEL WITH GFR  . CBC with Differential/Platelet  . TSH  . VITAMIN D 25 Hydroxy (Vit-D Deficiency, Fractures)

## 2017-05-07 NOTE — Assessment & Plan Note (Signed)
No new symptoms; continue full aspirin; continue statin

## 2017-05-07 NOTE — Assessment & Plan Note (Signed)
Continue prozac. 

## 2017-05-08 ENCOUNTER — Other Ambulatory Visit: Payer: Self-pay | Admitting: Family Medicine

## 2017-05-08 ENCOUNTER — Telehealth: Payer: Self-pay

## 2017-05-08 LAB — CBC WITH DIFFERENTIAL/PLATELET
BASOS PCT: 1 %
Basophils Absolute: 80 cells/uL (ref 0–200)
EOS ABS: 288 {cells}/uL (ref 15–500)
Eosinophils Relative: 3.6 %
HEMATOCRIT: 42.6 % (ref 35.0–45.0)
HEMOGLOBIN: 14.6 g/dL (ref 11.7–15.5)
Lymphs Abs: 2992 cells/uL (ref 850–3900)
MCH: 30.5 pg (ref 27.0–33.0)
MCHC: 34.3 g/dL (ref 32.0–36.0)
MCV: 89.1 fL (ref 80.0–100.0)
MPV: 10.5 fL (ref 7.5–12.5)
Monocytes Relative: 5.4 %
NEUTROS ABS: 4208 {cells}/uL (ref 1500–7800)
NEUTROS PCT: 52.6 %
Platelets: 267 10*3/uL (ref 140–400)
RBC: 4.78 10*6/uL (ref 3.80–5.10)
RDW: 12.9 % (ref 11.0–15.0)
Total Lymphocyte: 37.4 %
WBC mixed population: 432 cells/uL (ref 200–950)
WBC: 8 10*3/uL (ref 3.8–10.8)

## 2017-05-08 LAB — COMPLETE METABOLIC PANEL WITH GFR
AG RATIO: 1.7 (calc) (ref 1.0–2.5)
ALT: 5 U/L — AB (ref 6–29)
AST: 9 U/L — AB (ref 10–35)
Albumin: 4 g/dL (ref 3.6–5.1)
Alkaline phosphatase (APISO): 124 U/L (ref 33–130)
BUN: 7 mg/dL (ref 7–25)
CALCIUM: 9.5 mg/dL (ref 8.6–10.4)
CO2: 29 mmol/L (ref 20–32)
CREATININE: 0.69 mg/dL (ref 0.50–1.05)
Chloride: 101 mmol/L (ref 98–110)
GFR, EST NON AFRICAN AMERICAN: 99 mL/min/{1.73_m2} (ref >=60)
GFR, Est African American: 115 mL/min/{1.73_m2} (ref >=60)
GLUCOSE: 87 mg/dL (ref 65–139)
Globulin: 2.3 g/dL (calc) (ref 1.9–3.7)
POTASSIUM: 4.3 mmol/L (ref 3.5–5.3)
Sodium: 136 mmol/L (ref 135–146)
Total Bilirubin: 0.2 mg/dL (ref 0.2–1.2)
Total Protein: 6.3 g/dL (ref 6.1–8.1)

## 2017-05-08 LAB — LIPID PANEL
Cholesterol: 241 mg/dL — ABNORMAL HIGH (ref <200)
HDL: 55 mg/dL (ref >50)
LDL Cholesterol (Calc): 149 mg/dL (calc) — ABNORMAL HIGH
Non-HDL Cholesterol (Calc): 186 mg/dL (calc) — ABNORMAL HIGH (ref <130)
Total CHOL/HDL Ratio: 4.4 (calc) (ref <5.0)
Triglycerides: 230 mg/dL — ABNORMAL HIGH (ref <150)

## 2017-05-08 LAB — TSH: TSH: 1.31 m[IU]/L

## 2017-05-08 LAB — VITAMIN D 25 HYDROXY (VIT D DEFICIENCY, FRACTURES): VIT D 25 HYDROXY: 16 ng/mL — AB (ref 30–100)

## 2017-05-08 MED ORDER — VITAMIN D (ERGOCALCIFEROL) 1.25 MG (50000 UNIT) PO CAPS: 50000.0000 [IU] | ORAL_CAPSULE | ORAL | 1 refills | Status: AC

## 2017-05-08 MED ORDER — PRAVASTATIN SODIUM 40 MG PO TABS: 40.0000 mg | ORAL_TABLET | Freq: Every day | ORAL | 2 refills | Status: DC

## 2017-05-08 NOTE — Telephone Encounter (Signed)
-----   Message from Kerman PasseyMelinda P Lada, MD sent at 05/08/2017  1:21 PM EST ----- Please let the patient know that her cholesterol is high; start back on the cholesterol medicine (I sent refills, as she obviously had run out before seen yesterday); vitamin D is low, so start Rx vitamin D

## 2017-05-08 NOTE — Progress Notes (Signed)
Start back on statin and start Rx vit D

## 2017-05-08 NOTE — Telephone Encounter (Signed)
Called pt informed her of information below. Pt gave verbal understanding.  

## 2017-05-18 ENCOUNTER — Other Ambulatory Visit: Payer: Self-pay | Admitting: Family Medicine

## 2017-05-28 ENCOUNTER — Other Ambulatory Visit: Payer: Self-pay

## 2017-05-28 MED ORDER — PRAVASTATIN SODIUM 40 MG PO TABS: 40.0000 mg | ORAL_TABLET | Freq: Every day | ORAL | 0 refills | Status: DC

## 2017-05-28 NOTE — Telephone Encounter (Signed)
So sorry! Pravastatin. Thanks!

## 2017-05-28 NOTE — Telephone Encounter (Signed)
Which medicine?

## 2017-05-28 NOTE — Telephone Encounter (Signed)
Excellent Rx approved Thank you

## 2017-05-28 NOTE — Telephone Encounter (Signed)
Patient is requesting a 90 day supply to help comply better with medication. Please advise

## 2017-08-10 DIAGNOSIS — R569 Unspecified convulsions: Secondary | ICD-10-CM | POA: Diagnosis not present

## 2017-09-01 ENCOUNTER — Encounter: Payer: Self-pay | Admitting: Family Medicine

## 2017-09-21 ENCOUNTER — Other Ambulatory Visit: Payer: Self-pay | Admitting: Family Medicine

## 2017-10-16 ENCOUNTER — Telehealth: Payer: Self-pay

## 2017-10-16 DIAGNOSIS — F418 Other specified anxiety disorders: Principal | ICD-10-CM

## 2017-10-16 DIAGNOSIS — F3341 Major depressive disorder, recurrent, in partial remission: Secondary | ICD-10-CM

## 2017-10-16 NOTE — Telephone Encounter (Signed)
Please call patient Let her know that we received a call from her counselor We'll be glad to refer her to a psychiatrist Offer appt with me if desired Thank you

## 2017-10-16 NOTE — Telephone Encounter (Signed)
Left detailed voicemial 

## 2017-10-16 NOTE — Telephone Encounter (Signed)
Kim Gallagher who is her counselor states she wants you to see about referring her to psych or adjusting her meds.  To her she feels that she is depressed, not to the point of suicidal thoughts or hurting herself though.  But has been seeing her more often. Please advise

## 2017-10-16 NOTE — Telephone Encounter (Signed)
Copied from CRM [id number]. Topic: General - Other >> Oct 16, 2017  9:58 AM Raquel Sarna wrote: Felecia Jan - [facility] [phone]  Needing to discuss pt care and update in health, as well as referrals

## 2017-11-06 ENCOUNTER — Ambulatory Visit: Payer: Self-pay | Admitting: Family Medicine

## 2018-01-14 ENCOUNTER — Other Ambulatory Visit: Payer: Self-pay

## 2018-01-14 NOTE — Patient Outreach (Signed)
[facility]  01/14/2018  Kim CorneaLisa C Pascale 02/25/1966 [id number]   Medication Adherence call to Mrs. Kim LampLisa Gallagher left a message for patient to call back patient is due on Pravastatin 40 mg. Mrs. Kim Gallagher is showing past due under [facility].  Kim AbedAna Gallagher CPhT Pharmacy Technician [facility] Care Management Direct Dial [phone]  Fax [phone] [email or web address]

## 2018-02-22 ENCOUNTER — Other Ambulatory Visit: Payer: Self-pay

## 2018-02-22 ENCOUNTER — Other Ambulatory Visit: Payer: Self-pay | Admitting: Family Medicine

## 2018-02-22 DIAGNOSIS — F418 Other specified anxiety disorders: Secondary | ICD-10-CM

## 2018-02-22 DIAGNOSIS — F419 Anxiety disorder, unspecified: Secondary | ICD-10-CM

## 2018-02-22 DIAGNOSIS — F3341 Major depressive disorder, recurrent, in partial remission: Secondary | ICD-10-CM

## 2018-02-22 DIAGNOSIS — E785 Hyperlipidemia, unspecified: Secondary | ICD-10-CM

## 2018-02-22 NOTE — Progress Notes (Signed)
C3 care team referral for financial issues and transportation related to her psych issues

## 2018-02-23 ENCOUNTER — Ambulatory Visit: Payer: Self-pay | Admitting: *Deleted

## 2018-02-23 DIAGNOSIS — F418 Other specified anxiety disorders: Principal | ICD-10-CM

## 2018-02-23 DIAGNOSIS — E785 Hyperlipidemia, unspecified: Secondary | ICD-10-CM

## 2018-02-23 DIAGNOSIS — F3341 Major depressive disorder, recurrent, in partial remission: Secondary | ICD-10-CM

## 2018-02-23 NOTE — Patient Instructions (Signed)
CCM (Chronic Care Management) Team .   Janalyn Shy [facility] Nurse Care Coordinator  [phone]   Ruben Reason PharmD  Clinical Pharmacist  [phone]   Ms. Krizek was given information about Chronic Care Management services today including:  1. CCM service includes personalized support from designated clinical staff supervised by her physician, including individualized plan of care and coordination with other care providers 2. 24/7 contact phone numbers for assistance for urgent and routine care needs. 3. Service will only be billed when office clinical staff spend 20 minutes or more in a month to coordinate care. 4. Only one practitioner may furnish and bill the service in a calendar month. 5. The patient may stop CCM services at any time (effective at the end of the month) by phone call to the office staff. 6. The patient will be responsible for cost sharing (co-pay) of up to 20% of the service fee (after annual deductible is met).  Patient agreed to services and verbal consent obtained.

## 2018-02-23 NOTE — Chronic Care Management (AMB) (Cosign Needed)
  Chronic Care Management   Initial Visit Note  02/23/2018 Name: Kim Gallagher MRN: [id number] DOB: 07/11/1965  Referred by: Arnetha Courser, MD Reason for referral : Chronic Care Management (Medication Management, Financial Concerns)  Subjective: "I'm about to be evicted. I just cant make ends meet and I don't know what to do."  Assessment: Kim Gallagher is a 52 year old female primary care patient of Dr. Enid Derry who was referred to the CCM program for assistance with chronic disease management (HLD, Major Depressive Disorder) but primarily to help Kim Gallagher get connected to community resources for assistance with financial constraints.   Kim Gallagher has been followed by the [facility] Management pharmacy department for medication adherence calls and follow up in the past. She says she has not worked with the social work team at [facility] but would be grateful for any help.   Goals Addressed    . "I need help with managing everything financially" (pt-stated)        Clinical Goals: Over the next 14 days, patient will verbalize receipt of outreach by [facility] Management team for assistance with financial constraints.   Interventions: Referral to [place] Case Management social work team.      Plan: I will follow up on community case management referral in one week.    Kim Gallagher was given information about Chronic Care Management services today including:  1. CCM service includes personalized support from designated clinical staff supervised by her physician, including individualized plan of care and coordination with other care providers 2. 24/7 contact phone numbers for assistance for urgent and routine care needs. 3. Service will only be billed when office clinical staff spend 20 minutes or more in a month to coordinate care. 4. Only one practitioner may furnish and bill the service in a calendar month. 5. The patient may stop CCM services at any  time (effective at the end of the month) by phone call to the office staff. 6. The patient will be responsible for cost sharing (co-pay) of up to 20% of the service fee (after annual deductible is met).  Patient agreed to services and verbal consent obtained.  [facility] Management  [phone]

## 2018-04-05 ENCOUNTER — Other Ambulatory Visit: Payer: Self-pay

## 2018-04-05 NOTE — Patient Outreach (Signed)
[facility] Care Management  04/05/2018  [name] Feb 05, 1966 [id number]  Care coordination update: Patient will be followed up by [facility] case manager Marja Kays,  :  PLAN: No further follow up needed by this RNCM.  George Ina RN,BSN,CCM [facility] Telephonic  [phone]

## 2018-04-08 ENCOUNTER — Ambulatory Visit: Payer: Self-pay

## 2018-04-08 DIAGNOSIS — F3341 Major depressive disorder, recurrent, in partial remission: Secondary | ICD-10-CM

## 2018-04-08 DIAGNOSIS — E785 Hyperlipidemia, unspecified: Secondary | ICD-10-CM

## 2018-04-08 DIAGNOSIS — F418 Other specified anxiety disorders: Principal | ICD-10-CM

## 2018-04-08 NOTE — Chronic Care Management (AMB) (Signed)
  Chronic Care Management   Follow Up Note   04/08/2018 Name: Kim Gallagher MRN: [id number] DOB: Jul 16, 1965  Referred by: Kerman Passey, MD Reason for referral : Chronic Care Management (housing/financial resources)    Subjective: "I am going to be homeless if I don't get help with my rent"   Assessment: Kim Gallagher is a 52 year old female primary care patient of Dr. Baruch Gouty who was referred to the CCM program for assistance with chronic disease management (HLD, Major Depressive Disorder) but primarily to help Kim Gallagher get connected to community resources for assistance with financial constraints. Patient was referred to [facility] CM for Social Work follow up however has not received services.  Goals    .  "I am going to be evicted from my appartment soon" (pt-stated)     Patient states she is 2 months behind on her rent and will be evicted soon if she does not pay in its entirety. She currently receives 1246.00 in disability monthly. 246.00 is taken out for student loans. Her monthly rent is 450.00 and does not include utilities. She denies needs for medication assistance, food, or transportation. 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 She states she has a friend that provides transportation for her IADLs including grocery shopping. [name] states she does not receive any other financial assistance. She has contacted local churches (none of the resources given today) and the [employer] and told no help was available.  Clinical Goals: Over the next 7 days, patient will verbalize utilization of housing resources  Interventions:  (1)Housing resources with contact verbally given including:    Emergency Housing Resources:  [employer] [phone]  [facility] [phone]   Housing Assistance:  [employer] [phone]  [facility] [phone]  [employer]  [phone]    (2)Housing Resource List mailed to patient including Income based appartment                             complexes, real estate agencies, and boarding houses.      Plan: Will reach out to patient early next week to follow up on housing plan and utilization of resources.     Markeese Boyajian E. Suzie Portela, RN, BSN Nurse Care Coordinator [facility] / [facility] Care Management  [phone]

## 2018-04-08 NOTE — Patient Instructions (Addendum)
1. Utilize resources given today for housing assistance. 2. Call your CCM team for additional resources if needed.  Goals    .  "I am going to be evicted from my appartment soon" (pt-stated)      Clinical Goals: Over the next 7 days, patient will verbalize utilization of housing resources  Interventions:  (1)Housing resources with contact verbally given including:    Emergency Housing Resources:  [employer] [phone]  [facility] [employer] [phone]   Housing Assistance:  [place] [employer] [phone]  [facility] Authority [phone]  [place] [employer] [facility] [phone]    (2)Housing Resource List mailed to patient including Income based appartment                                       complexes, real estate agencies, and boarding houses.     CCM (Chronic Care Management) Team   Yvone Neu RN, BSN Nurse Care Coordinator  [phone]  Karalee Height PharmD  Clinical Pharmacist  [phone]

## 2018-04-13 ENCOUNTER — Ambulatory Visit (INDEPENDENT_AMBULATORY_CARE_PROVIDER_SITE_OTHER): Payer: Self-pay

## 2018-04-13 DIAGNOSIS — F3341 Major depressive disorder, recurrent, in partial remission: Secondary | ICD-10-CM

## 2018-04-13 DIAGNOSIS — F418 Other specified anxiety disorders: Secondary | ICD-10-CM

## 2018-04-13 DIAGNOSIS — E785 Hyperlipidemia, unspecified: Secondary | ICD-10-CM

## 2018-04-13 DIAGNOSIS — I639 Cerebral infarction, unspecified: Secondary | ICD-10-CM

## 2018-04-13 NOTE — Chronic Care Management (AMB) (Signed)
  Chronic Care Management   Follow Up Note   04/13/2018 Name: Kim CorneaLisa C Gallagher MRN: [id number] DOB: 05/23/1966  Referred by: Kerman PasseyLada, Melinda P, MD Reason for referral : Chronic Care Management (follow up housing resources)    Subjective: "No body will help me"   Objective:   Assessment: Kim Gallagher is a 52 year old female primary care patient of Dr. Baruch GoutyMelinda Lada who was referred to the CCM program for assistance with chronic disease management (HLD, Major Depressive Disorder)but primarily to helpMs. Clappget connected to community resources for assistance with financial constraints.  Goals Addressed    .  "I am going to be evicted from my appartment soon" (pt-stated)       Kim Gallagher calls today stating "no one will help me", referring to housing resources given to patient on Friday by the CCM RN CM. Upon further enquiry, Kim Gallagher admits that [employer] will help her with a one time, one month rent however she told them she was not able to cover the second month that is due. Unfortunately, because of her request for both months, she was told no funds were available for that amount. Kim Gallagher then states she has not been declined help by the other agencies, they have just not returned her call as of date. Kim Gallagher states she receives 1200.00 in disability. Bills include 200.00 student loan, 100.00 utilities, 200.00 grocery, 450.00 rent. Patient unable to verbalize additional expenses.  Clinical Goals: Over the next 7 days, patient will verbalize receipt of mailed housing resources and utilization of those resources.  Interventions:  Reinforced utilization of housing resources previously given. Encouraged patient to re-contact agencies that have not returned her call. Discussed budgeting options.   Emergency Housing Resources:  [employer] [phone]  [facility] [phone]   Housing Assistance:  [facility] [phone]  [facility]  [phone]  [facility] [phone]          Plan: The CCM Team will be available to assist patient as needed.      Kalesha Irving E. Suzie PortelaPayne, RN, BSN Nurse Care Coordinator [facility]  [phone]

## 2018-04-13 NOTE — Patient Instructions (Signed)
1. Utilize resources given today for housing assistance. 2. Please check your mail to see if you have received housing resources previously sent by CCM Team 2. Call your CCM team as needed  Goals Addressed    .  "I am going to be evicted from my appartment soon" (pt-stated)        Clinical Goals: Over the next 7 days, patient will verbalize receipt of mailed housing resources and utilization of those resources.  Interventions:  Reinforced utilization of housing resources previously given.   Emergency Housing Resources:  [employer] [phone]  [facility] [phone]   Housing Assistance:  [facility] [phone]  [facility] [phone]  [facility] [phone]       CCM (Chronic Care Management) Team   Yvone NeuPortia Nori Winegar RN, BSN Nurse Care Coordinator  [phone]  Karalee HeightJulie Hedrick PharmD  Clinical Pharmacist  [phone]  The patient verbalized understanding of instructions provided today and declined a print copy of patient instruction materials.

## 2018-07-07 ENCOUNTER — Telehealth: Payer: Self-pay | Admitting: Family Medicine

## 2018-07-07 DIAGNOSIS — F419 Anxiety disorder, unspecified: Secondary | ICD-10-CM

## 2018-07-07 NOTE — Telephone Encounter (Signed)
Patient has not been seen here by a provider since 2018 She needs an appointment for f/u anyway However, for her psychiatry medicines, she needs to be contacting her psychiatrist She was referred in May of 2019 to psychiatry

## 2018-07-08 NOTE — Telephone Encounter (Signed)
Spoke with pt and she currently does not have a psychiatrist due to her moving to [place]. She is not able to come in here due to transportation issues due to her moving to [place].

## 2018-07-08 NOTE — Telephone Encounter (Signed)
LVM for pt to call and schedule an appt °

## 2018-07-08 NOTE — Telephone Encounter (Signed)
If she is unable to get to our office any more, we'll certainly recommend that she find a primary closer to where she lives so she can be evaluated and get medicine refills

## 2018-07-16 ENCOUNTER — Encounter: Payer: Self-pay | Admitting: Nurse Practitioner

## 2018-07-16 ENCOUNTER — Other Ambulatory Visit (INDEPENDENT_AMBULATORY_CARE_PROVIDER_SITE_OTHER): Payer: Medicare Other

## 2018-07-16 ENCOUNTER — Ambulatory Visit (INDEPENDENT_AMBULATORY_CARE_PROVIDER_SITE_OTHER): Payer: Medicare Other | Admitting: Nurse Practitioner

## 2018-07-16 VITALS — BP 130/88 | HR 91 | Ht 65.0 in | Wt 155.0 lb

## 2018-07-16 DIAGNOSIS — F419 Anxiety disorder, unspecified: Secondary | ICD-10-CM

## 2018-07-16 DIAGNOSIS — Z0000 Encounter for general adult medical examination without abnormal findings: Secondary | ICD-10-CM

## 2018-07-16 DIAGNOSIS — E785 Hyperlipidemia, unspecified: Secondary | ICD-10-CM | POA: Diagnosis not present

## 2018-07-16 DIAGNOSIS — F418 Other specified anxiety disorders: Secondary | ICD-10-CM

## 2018-07-16 DIAGNOSIS — F3341 Major depressive disorder, recurrent, in partial remission: Secondary | ICD-10-CM

## 2018-07-16 DIAGNOSIS — I639 Cerebral infarction, unspecified: Secondary | ICD-10-CM | POA: Diagnosis not present

## 2018-07-16 DIAGNOSIS — G40909 Epilepsy, unspecified, not intractable, without status epilepticus: Secondary | ICD-10-CM | POA: Diagnosis not present

## 2018-07-16 LAB — COMPREHENSIVE METABOLIC PANEL
ALT: 6 U/L (ref 0–35)
AST: 11 U/L (ref 0–37)
Albumin: 4.1 g/dL (ref 3.5–5.2)
Alkaline Phosphatase: 173 U/L — ABNORMAL HIGH (ref 39–117)
BUN: 7 mg/dL (ref 6–23)
CHLORIDE: 104 meq/L (ref 96–112)
CO2: 31 meq/L (ref 19–32)
Calcium: 9.3 mg/dL (ref 8.4–10.5)
Creatinine, Ser: 0.73 mg/dL (ref 0.40–1.20)
GFR: 82.93 mL/min (ref >60.00)
Glucose, Bld: 84 mg/dL (ref 70–99)
Potassium: 4.4 mEq/L (ref 3.5–5.1)
SODIUM: 140 meq/L (ref 135–145)
Total Bilirubin: 0.2 mg/dL (ref 0.2–1.2)
Total Protein: 6.6 g/dL (ref 6.0–8.3)

## 2018-07-16 LAB — CBC
HEMATOCRIT: 43.2 % (ref 36.0–46.0)
Hemoglobin: 14.5 g/dL (ref 12.0–15.0)
MCHC: 33.5 g/dL (ref 30.0–36.0)
MCV: 92.7 fl (ref 78.0–100.0)
Platelets: 269 10*3/uL (ref 150.0–400.0)
RBC: 4.67 Mil/uL (ref 3.87–5.11)
RDW: 13.2 % (ref 11.5–15.5)
WBC: 6.6 10*3/uL (ref 4.0–10.5)

## 2018-07-16 LAB — LIPID PANEL
CHOL/HDL RATIO: 4
Cholesterol: 203 mg/dL — ABNORMAL HIGH (ref 0–200)
HDL: 56.1 mg/dL (ref >39.00)
LDL CALC: 127 mg/dL — AB (ref 0–99)
NonHDL: 146.94
Triglycerides: 98 mg/dL (ref 0.0–149.0)
VLDL: 19.6 mg/dL (ref 0.0–40.0)

## 2018-07-16 MED ORDER — VENLAFAXINE HCL ER 75 MG PO CP24: 75.0000 mg | ORAL_CAPSULE | Freq: Every day | ORAL | 1 refills | Status: DC

## 2018-07-16 NOTE — Assessment & Plan Note (Signed)
Continue pravastatin She is fasting Update labs - Lipid panel; Future

## 2018-07-16 NOTE — Assessment & Plan Note (Signed)
Continue f/u with neurology. 

## 2018-07-16 NOTE — Assessment & Plan Note (Signed)
Declines flu vacc, mammogram, otherwise HM is up to date

## 2018-07-16 NOTE — Progress Notes (Signed)
Kim Gallagher is a 53 y.o. female with the following history as recorded in EpicCare:  Patient Active Problem List   Diagnosis Date Noted  . Abortion 05/07/2017  . Gravida 6, not currently pregnant 05/07/2017  . Parity 1 05/07/2017  . Absence of bladder continence 05/07/2017  . Chronic recurrent major depressive disorder (HCC) 05/07/2017  . Difficult or painful urination 05/07/2017  . Hyperkalemia 06/11/2016  . Vitamin B12 deficiency 06/10/2016  . Goiter, nodular 06/10/2016  . Rhinitis medicamentosa 05/11/2016  . Colonic thickening 05/07/2016  . Porcelain gallbladder 05/07/2016  . Anxiety 05/07/2016  . Vaginal mass 04/29/2016  . Night sweats 04/29/2016  . Breast lump or mass 04/29/2016  . Screening for cervical cancer 04/29/2016  . Hx of abnormal cervical Pap smear 04/29/2016  . Therapeutic drug monitoring 03/18/2016  . Marijuana smoker 11/12/2015  . Controlled substance agreement terminated 11/12/2015  . Vitamin D deficiency 11/12/2015  . Alkaline phosphatase elevation 11/02/2015  . Fatigue 11/02/2015  . Personality change due to stroke 10/05/2015  . Hyperlipidemia LDL goal <100 04/05/2015  . Heavy tobacco smoker 04/05/2015  . Major depressive disorder, recurrent episode, in partial remission with anxious distress (HCC) 04/05/2015  . Seizure disorder (HCC) 04/05/2015  . History of TIA (transient ischemic attack) 04/05/2015  . Depression 02/27/2015  . Stroke [facility](HCC) 02/27/2015    Current Outpatient Medications  Medication Sig Dispense Refill  . aspirin 325 MG tablet Take 325 mg by mouth daily.    . busPIRone (BUSPAR) 15 MG tablet TAKE 1 TABLET BY MOUTH TWICE (2) DAILY 60 tablet 5  . Cholecalciferol (VITAMIN D3 PO) Take 1 each by mouth daily.    . Cyanocobalamin (VITAMIN B 12 PO) Take 1 each by mouth daily.    Marland Kitchen. ibuprofen (ADVIL,MOTRIN) 200 MG tablet Take 600 mg by mouth every 6 (six) hours as needed for mild pain or moderate pain.    Marland Kitchen. lamoTRIgine (LAMICTAL) 150 MG tablet  Take 1 tablet by mouth 2 (two) times daily.    . phenytoin (DILANTIN) 100 MG ER capsule Take 300 mg by mouth at bedtime.    . pravastatin (PRAVACHOL) 40 MG tablet TAKE 1 TABLET BY MOUTH EVERY NIGHT AT BEDTIME 90 tablet 0  . venlafaxine XR (EFFEXOR-XR) 75 MG 24 hr capsule Take 1 capsule (75 mg total) by mouth daily with breakfast. 30 capsule 1   No current facility-administered medications for this visit.     Allergies: Sulfa antibiotics  Past Medical History:  Diagnosis Date  . Anxiety   . Chronic kidney disease   . Chronic major depressive disorder   . Dysuria   . Heavy smoker   . History of TIA (transient ischemic attack)   . Hyperlipidemia   . Hyperlipidemia, acquired   . Seizures (HCC)    r/t to stroke  . Stroke (HCC) 11/16/2006   left sided weakness  . Urinary incontinence in female     Past Surgical History:  Procedure Laterality Date  . CHOLECYSTECTOMY N/A 07/10/2016   Procedure: LAPAROSCOPIC CHOLECYSTECTOMY WITH INTRAOPERATIVE CHOLANGIOGRAM;  Surgeon: Kieth BrightlySeeplaputhur G Sankar, MD;  Location: [facility] ORS;  Service: General;  Laterality: N/A;  . COLONOSCOPY WITH PROPOFOL N/A 05/13/2016   Procedure: COLONOSCOPY WITH PROPOFOL;  Surgeon: Kieth BrightlySeeplaputhur G Sankar, MD;  Location: [facility] ENDOSCOPY;  Service: Endoscopy;  Laterality: N/A;  . COLONOSCOPY WITH PROPOFOL N/A 05/21/2016   Procedure: COLONOSCOPY WITH PROPOFOL;  Surgeon: Kieth BrightlySeeplaputhur G Sankar, MD;  Location: [facility] ENDOSCOPY;  Service: Endoscopy;  Laterality: N/A;  . TUBAL LIGATION  Family History  Problem Relation Age of Onset  . Kidney disease Mother   . Hypertension Mother   . Depression Mother   . Diabetes Mother   . Early death Mother   . Drug abuse Mother   . Heart disease Mother   . Hyperlipidemia Mother   . Stroke Father   . Hypertension Father   . Cancer Father        thyroid  . Diabetes Father   . Early death Father   . Drug abuse Father   . Diabetes Sister   . Early death Sister   . Drug abuse Sister   .  Hyperlipidemia Sister   . Diabetes Brother   . Depression Maternal Grandmother   . Alcohol abuse Maternal Grandfather   . Cancer Maternal Grandfather   . Cancer Paternal Grandfather     Social History   Tobacco Use  . Smoking status: Current Every Day Smoker    Packs/day: 1.00    Types: Cigarettes  . Smokeless tobacco: Never Used  Substance Use Topics  . Alcohol use: No    Alcohol/week: 0.0 standard drinks     Subjective:  Ms Fennema is here today to establish care as a new patient to our practice, transferring from prior provider in [place] due to recently moving to [place] Aside from primary care, she is routinely followed by neurology for seizures and history of stroke (2008)- last seizure 2 years ago, reports well controlled on current medication. She would like to discuss anxiety/depression today Well also update annual labs, lipid testing - she Is fasting- maintained on pravastatin for HLD which she takes daily as prescribed She is overdue for a mammogram this year but she declines this test  She is maintained on prozac 60 daily, buspar 15 BID for anxiety, depression. She reports daily med compliance without noted adverse effects. Due to continued depression, She was Actually Switched from zoloft to prozac last year some time but not noticed much improvement in her depression with the switch She Does feel her anxiety is well controlled by the buspar She says she weaned off xanax last year due to risk of side effects, she really wants to go back on xanax because it really helped her depression because it made her "not care about it."  Review of Systems  Constitutional: Negative for chills and fever.  HENT: Negative for hearing loss.   Eyes: Negative for blurred vision and double vision.  Respiratory: Negative for shortness of breath.   Cardiovascular: Negative for chest pain.  Gastrointestinal: Negative for abdominal pain, nausea and vomiting.  Musculoskeletal: Negative for  falls.  Neurological: Negative for speech change, loss of consciousness and weakness.  Psychiatric/Behavioral: Positive for depression. Negative for memory loss and suicidal ideas. The patient is not nervous/anxious.     Objective:  Vitals:   07/16/18 1024  BP: 130/88  Pulse: 91  SpO2: 97%  Weight: 155 lb (70.3 kg)  Height: 5\' 5"  (1.651 m)    General: Well developed, well nourished, in no acute distress  Skin : Warm and dry.  Head: Normocephalic and atraumatic  Eyes: Sclera and conjunctiva clear; pupils round and reactive to light; extraocular movements intact  Oropharynx: Pink, supple. No suspicious lesions  Neck: Supple without thyromegaly Lungs: Respirations unlabored; clear to auscultation bilaterally without wheeze, rales, rhonchi  CVS exam: normal rate and regular rhythm, S1 and S2 normal.  Extremities: No edema, cyanosis, clubbing  Vessels: Symmetric bilaterally  Neurologic: Alert and oriented; speech intact; face  symmetrical; moves all extremities well; CNII-XII intact without focal deficit  Psychiatric: Normal mood and affect.   Assessment:  1. Major depressive disorder, recurrent episode, in partial remission with anxious distress (HCC)   2. Cerebrovascular accident (CVA), unspecified mechanism (HCC)   3. Seizure disorder (HCC)   4. Hyperlipidemia LDL goal <100   5. Anxiety   6. Healthcare maintenance     Plan:   Return in about 1 month (around 08/14/2018) for f/u: anx and depression.  Orders Placed This Encounter  Procedures  . CBC    Standing Status:   Future    Number of Occurrences:   1    Standing Expiration Date:   07/17/2019  . Comprehensive metabolic panel    Standing Status:   Future    Number of Occurrences:   1    Standing Expiration Date:   07/17/2019  . Lipid panel    Standing Status:   Future    Number of Occurrences:   1    Standing Expiration Date:   07/17/2019    Requested Prescriptions   Signed Prescriptions Disp Refills  . venlafaxine  XR (EFFEXOR-XR) 75 MG 24 hr capsule 30 capsule 1    Sig: Take 1 capsule (75 mg total) by mouth daily with breakfast.

## 2018-07-16 NOTE — Patient Instructions (Signed)
Head downstairs for labs today  Stop prozac Start effexor 75 mg once daily Continue buspar Some side effects such as nausea, drowsiness and weight gain can occur.  Also rarely people have experienced suicidal thoughts when taking this medication.  Please discontinue the medication and go directly to ED if this occurs.  Id like to see you back in about 1 month to evaluate progress.     Major Depressive Disorder, Adult Major depressive disorder (MDD) is a mental health condition. MDD often makes you feel sad, hopeless, or helpless. MDD can also cause symptoms in your body. MDD can affect your:  Work.  School.  Relationships.  Other normal activities. MDD can range from mild to very bad. It may occur once (single episode MDD). It can also occur many times (recurrent MDD). The main symptoms of MDD often include:  Feeling sad, depressed, or irritable most of the time.  Loss of interest. MDD symptoms also include:  Sleeping too much or too little.  Eating too much or too little.  A change in your weight.  Feeling tired (fatigue) or having low energy.  Feeling worthless.  Feeling guilty.  Trouble making decisions.  Trouble thinking clearly.  Thoughts of suicide or harming others.  Feeling weak.  Feeling agitated.  Keeping yourself from being around other people (isolation). Follow these instructions at home: Activity  Do these things as told by your doctor: ? Go back to your normal activities. ? Exercise regularly. ? Spend time outdoors. Alcohol  Talk with your doctor about how alcohol can affect your antidepressant medicines.  Do not drink alcohol. Or, limit how much alcohol you drink. ? This means no more than 1 drink a day for nonpregnant women and 2 drinks a day for men. One drink equals one of these:  12 oz of beer.  5 oz of wine.  1 oz of hard liquor. General instructions  Take over-the-counter and prescription medicines only as told by your  doctor.  Eat a healthy diet.  Get plenty of sleep.  Find activities that you enjoy. Make time to do them.  Think about joining a support group. Your doctor may be able to suggest a group for you.  Keep all follow-up visits as told by your doctor. This is important. Where to find more information:  [employer] on Mental Illness: ? www.nami.org  [place] [employer] of Mental Health: ? http://www.maynard.net/  National Suicide Prevention Lifeline: ? 971-165-7916. This is free, 24-hour help. Contact a doctor if:  Your symptoms get worse.  You have new symptoms. Get help right away if:  You self-harm.  You see, hear, taste, smell, or feel things that are not present (hallucinate). If you ever feel like you may hurt yourself or others, or have thoughts about taking your own life, get help right away. You can go to your nearest emergency department or call:  Your local emergency services (911 in the [place]  A suicide crisis helpline, such as the National Suicide Prevention Lifeline: ? 978-007-7485. This is open 24 hours a day. This information is not intended to replace advice given to you by your health care provider. Make sure you discuss any questions you have with your health care provider. Document Released: 04/30/2015 Document Revised: 02/03/2016 Document Reviewed: 02/03/2016 Elsevier Interactive Patient Education  2019 [employer].

## 2018-07-16 NOTE — Assessment & Plan Note (Signed)
Discussed adverse effects/indications of xanax and that xanax is not indicated for depression Discussed other options to treat depression - she would like to try new daily medication - she will stop prozac, start effexor, continue buspar- medication dosing, side effects discussed RTC in 1 month for F/U- we discussed possibility of psychiatry referral if no improvmenet - CBC; Future - Comprehensive metabolic panel; Future - venlafaxine XR (EFFEXOR-XR) 75 MG 24 hr capsule; Take 1 capsule (75 mg total) by mouth daily with breakfast.  Dispense: 30 capsule; Refill: 1

## 2018-07-16 NOTE — Assessment & Plan Note (Signed)
Discussed adverse effects/indications of xanax and that xanax is not indicated for depression Discussed other options to treat depression - she would like to try new daily medication - she will stop prozac, start effexor, continue buspar- medication dosing, side effects discussed Home management, red flags and return precautions including when to seek immediate/emergency care discussed and printed on AVS RTC in 1 month for F/U- we discussed possibility of psychiatry referral if no improvmenet - CBC; Future - Comprehensive metabolic panel; Future - venlafaxine XR (EFFEXOR-XR) 75 MG 24 hr capsule; Take 1 capsule (75 mg total) by mouth daily with breakfast.  Dispense: 30 capsule; Refill: 1

## 2018-07-19 NOTE — Chronic Care Management (AMB) (Signed)
  Chronic Care Management   Note  07/19/2018 Name: Kim Gallagher MRN: [id number] DOB: May 06, 1966   Referral source: Dr. Baruch Gouty Reason for referral: Resources for Housing  The CCM team case is being closed due to the following reasons:  The patient is no longer eligible for the [facility] program (ineligible insurance or provider). Kim Gallagher recently transitioned to a new primary care provider outside of [facility].  Thank you for allowing Chronic Care Management to be involved in this patient's care.        Follow up plan: Next PCP appointment scheduled for: 08/18/18 with Alphonse Guild, NP at [facility].   Karalee Height, PharmD Clinical Pharmacist [facility] [phone]

## 2018-07-20 ENCOUNTER — Ambulatory Visit: Payer: Medicare HMO | Admitting: Nurse Practitioner

## 2018-07-22 ENCOUNTER — Other Ambulatory Visit: Payer: Self-pay | Admitting: Nurse Practitioner

## 2018-07-22 MED ORDER — PRAVASTATIN SODIUM 40 MG PO TABS: 40.0000 mg | ORAL_TABLET | Freq: Every day | ORAL | 0 refills | Status: DC

## 2018-07-22 MED ORDER — BUSPIRONE HCL 15 MG PO TABS: ORAL_TABLET | ORAL | 1 refills | Status: DC

## 2018-07-22 NOTE — Telephone Encounter (Signed)
Requested medication (s) are due for refill today yes  Requested medication (s) are on the active medication list yes  Future visit scheduled yes  Routing to PCP due to this is a non-delegated medication   Requested Prescriptions  Pending Prescriptions Disp Refills   phenytoin (DILANTIN) 100 MG ER capsule      Sig: Take 3 capsules (300 mg total) by mouth at bedtime.     Not Delegated - Neurology:  Anticonvulsants - phenytoin Failed - 07/22/2018  2:25 PM      Failed - This refill cannot be delegated      Failed - Phenytoin (serum) in normal range and within 360 days    Phenytoin Lvl  Date Value Ref Range Status  03/28/2016 12.8 10.0 - 20.0 ug/mL Final         Passed - ALT in normal range and within 360 days    ALT  Date Value Ref Range Status  07/16/2018 6 0 - 35 U/L Final         Passed - AST in normal range and within 360 days    AST  Date Value Ref Range Status  07/16/2018 11 0 - 37 U/L Final         Passed - HGB in normal range and within 360 days    Hemoglobin  Date Value Ref Range Status  07/16/2018 14.5 12.0 - 15.0 g/dL Final         Passed - HCT in normal range and within 360 days    HCT  Date Value Ref Range Status  07/16/2018 43.2 36.0 - 46.0 % Final         Passed - PLT in normal range and within 360 days    Platelets  Date Value Ref Range Status  07/16/2018 269.0 150.0 - 400.0 K/uL Final         Passed - WBC in normal range and within 360 days    WBC  Date Value Ref Range Status  07/16/2018 6.6 4.0 - 10.5 K/uL Final         Passed - Valid encounter within last 12 months    Recent Outpatient Visits          6 days ago Major depressive disorder, recurrent episode, in partial remission with anxious distress (HCC)   [facility] Evaristo Bury, NP      Future Appointments            In 3 weeks Aura Camps, Audie Box, NP [facility]         Signed Prescriptions Disp Refills    busPIRone (BUSPAR) 15 MG tablet 60 tablet 1    Sig: TAKE 1 TABLET BY MOUTH TWICE (2) DAILY     Psychiatry: Anxiolytics/Hypnotics - Non-controlled Passed - 07/22/2018  2:25 PM      Passed - Valid encounter within last 6 months    Recent Outpatient Visits          6 days ago Major depressive disorder, recurrent episode, in partial remission with anxious distress (HCC)   [facility] Nephew, Audie Box, NP      Future Appointments            In 3 weeks Shambley, Audie Box, NP [facility]          pravastatin (PRAVACHOL) 40 MG tablet 90 tablet 0    Sig: Take 1 tablet (40 mg total) by  mouth at bedtime.     Cardiovascular:  Antilipid - Statins Failed - 07/22/2018  2:25 PM      Failed - Total Cholesterol in normal range and within 360 days    Cholesterol, Total  Date Value Ref Range Status  11/06/2015 216 (H) 100 - 199 mg/dL Final   Cholesterol  Date Value Ref Range Status  07/16/2018 203 (H) 0 - 200 mg/dL Final    Comment:    ATP III Classification       Desirable:  < 200 mg/dL               Borderline High:  200 - 239 mg/dL          High:  > = 356 mg/dL         Failed - LDL in normal range and within 360 days    LDL Cholesterol (Calc)  Date Value Ref Range Status  05/07/2017 149 (H) mg/dL (calc) Final    Comment:    Reference range: <100 . Desirable range <100 mg/dL for primary prevention;   <70 mg/dL for patients with CHD or diabetic patients  with > or = 2 CHD risk factors. Marland Kitchen LDL-C is now calculated using the Martin-Hopkins  calculation, which is a validated novel method providing  better accuracy than the Friedewald equation in the  estimation of LDL-C.  Horald Pollen et al. Lenox Ahr. 8616;837(29): 2061-2068  (http://education.QuestDiagnostics.com/faq/FAQ164)    LDL Cholesterol  Date Value Ref Range Status  07/16/2018 127 (H) 0 - 99 mg/dL Final         Passed - HDL in normal range and within 360 days    HDL   Date Value Ref Range Status  07/16/2018 56.10 >39.00 mg/dL Final  07/13/1550 65 >08 mg/dL Final         Passed - Triglycerides in normal range and within 360 days    Triglycerides  Date Value Ref Range Status  07/16/2018 98.0 0.0 - 149.0 mg/dL Final    Comment:    Normal:  <150 mg/dLBorderline High:  150 - 199 mg/dL         Passed - Patient is not pregnant      Passed - Valid encounter within last 12 months    Recent Outpatient Visits          6 days ago Major depressive disorder, recurrent episode, in partial remission with anxious distress (HCC)   [facility] Evaristo Bury, NP      Future Appointments            In 3 weeks Shambley, Audie Box, NP [facility]

## 2018-07-22 NOTE — Telephone Encounter (Signed)
Approved per protocol. Refilled x1-patient due for follow-up on 08/18/18.

## 2018-07-22 NOTE — Telephone Encounter (Signed)
Copied from CRM [id number]. Topic: Quick Communication - Rx Refill/Question >> Jul 22, 2018  2:20 PM [place], [place] D wrote: Medication: pravastatin (PRAVACHOL) 40 MG tablet / phenytoin (DILANTIN) 100 MG ER capsule / busPIRone (BUSPAR) 15 MG tablet  Has the patient contacted their pharmacy? Yes.   (Agent: If no, request that the patient contact the pharmacy for the refill.) (Agent: If yes, when and what did the pharmacy advise?)  Preferred Pharmacy (with phone number or street name): [id number] [place], [place] - [address] AT [facility] OF [facility] & [address] [phone] (Phone) [phone] (Fax)  Agent: Please be advised that RX refills may take up to 3 business days. We ask that you follow-up with your pharmacy.

## 2018-07-23 MED ORDER — PHENYTOIN SODIUM EXTENDED 100 MG PO CAPS: 300.0000 mg | ORAL_CAPSULE | Freq: Every day | ORAL | 0 refills | Status: DC

## 2018-07-26 ENCOUNTER — Other Ambulatory Visit: Payer: Self-pay | Admitting: Nurse Practitioner

## 2018-07-26 DIAGNOSIS — R748 Abnormal levels of other serum enzymes: Secondary | ICD-10-CM

## 2018-08-18 ENCOUNTER — Ambulatory Visit: Payer: Medicare Other | Admitting: Nurse Practitioner

## 2018-08-23 ENCOUNTER — Telehealth (INDEPENDENT_AMBULATORY_CARE_PROVIDER_SITE_OTHER): Payer: Medicare Other | Admitting: *Deleted

## 2018-08-23 ENCOUNTER — Ambulatory Visit: Payer: Medicare Other | Admitting: Nurse Practitioner

## 2018-08-23 DIAGNOSIS — F419 Anxiety disorder, unspecified: Secondary | ICD-10-CM

## 2018-08-23 DIAGNOSIS — G40909 Epilepsy, unspecified, not intractable, without status epilepticus: Secondary | ICD-10-CM | POA: Diagnosis not present

## 2018-08-23 DIAGNOSIS — F418 Other specified anxiety disorders: Secondary | ICD-10-CM

## 2018-08-23 DIAGNOSIS — F3341 Major depressive disorder, recurrent, in partial remission: Secondary | ICD-10-CM

## 2018-08-23 DIAGNOSIS — F339 Major depressive disorder, recurrent, unspecified: Secondary | ICD-10-CM

## 2018-08-23 MED ORDER — VENLAFAXINE HCL ER 150 MG PO CP24: 150.0000 mg | ORAL_CAPSULE | Freq: Every day | ORAL | 1 refills | Status: DC

## 2018-08-23 NOTE — Telephone Encounter (Signed)
Cumulative time during 7-day interval 8 minutes, there was not an associated office visit for this concern within a 7 day period. Verbal consent for services obtained from patient prior to services given. Names of all persons present for services: Evaristo Bury, NP,  Chief complaint: anxiety and depression follow up History, background, results pertinent:  Past Medical History:  Diagnosis Date  . Anxiety   . Chronic kidney disease   . Chronic major depressive disorder   . Dysuria   . Heavy smoker   . History of TIA (transient ischemic attack)   . Hyperlipidemia   . Hyperlipidemia, acquired   . Seizures (HCC)    r/t to stroke  . Stroke (HCC) 11/16/2006   left sided weakness  . Urinary incontinence in female   First saw her to establish care on 07/16/18, she was having continued feelings of depression despite daily dosing of prozac, buspar She wanted to go back on xanax, which she said had helped with her depression in the past, however after discussion of medication risks and benefits, we decided to try having her switch prozac to effexor, and continue buspar 15 BID. Today, via phone call, she tells me that she has continued buspar, stopped prozac and started effexor as instructed, no noted side effects and feeling some improvement in depression but not completely better, no SI or HI. She also requests referral to neurology today, history of seizures, has not seen neurology for routine follow up yet this year and would prefer to see neurology in [place] A/P/next steps:  Major depressive disorder, recurrent episode, in partial remission with anxious distress (HCC), Anxiety Symptoms have improved but persistent Discussed options to treat, she would like to try increasing effexor- will increase to 150 daily- dosing, side effects discussed Continue buspar at current dosage Agreeable to counseling referral She will RTC in 1 month for F/U - venlafaxine XR (EFFEXOR-XR) 150 MG 24 hr  capsule; Take 1 capsule (150 mg total) by mouth daily with breakfast.  Dispense: 30 capsule; Refill: 1 - Ambulatory referral to Psychology Seizure disorder [facility]) Needs to re-establish with neurology for routine management of seizures, seizure medications - Ambulatory referral to Neurology

## 2018-08-23 NOTE — Telephone Encounter (Signed)
I called patient re:08/23/18 OV for anxiety/depression. In regards to COVID-19 pandemic I offered patient to keep OV today with PCP or asked if a tele visit would be more appropriate. I advised her of a potential co pay of $15. Patient verbalized understanding and prefers tele visit. I advised patient PCP will be calling her later today. Patient states she is available to speak with PCP any time today.

## 2018-09-03 ENCOUNTER — Telehealth: Payer: Medicare Other | Admitting: Neurology

## 2018-09-05 IMAGING — CT CT ABD-PELV W/ CM
2 of 3 series · 13 of 31 positions shown, 16 images · IV contrast (iopamidol)
Comparison: None.

CLINICAL DATA: 20 pound weight loss past month

EXAM:
CT CHEST, ABDOMEN, AND PELVIS WITH CONTRAST
TECHNIQUE: Multidetector CT imaging of the chest, abdomen and pelvis was
performed following the standard protocol during bolus
administration of intravenous contrast.
CONTRAST:  100mL TX4BCG-G66 IOPAMIDOL (TX4BCG-G66) INJECTION 61%

[Series 2: cap with · axial · 0.68mm/px · z∈[-1006,-506]mm · 10 of 126 slices shown, 13 images]
[im 13/126  mediastinal]
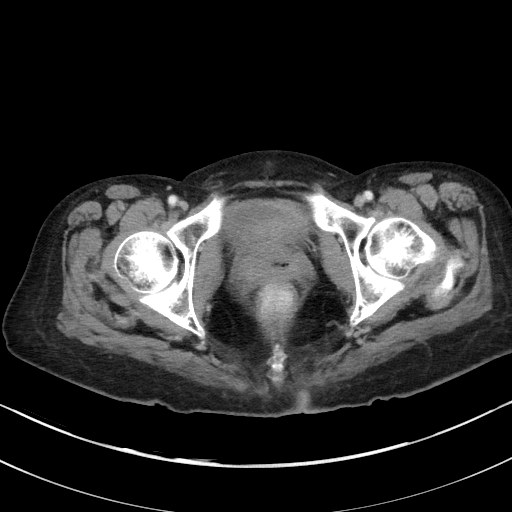
[im 13/126  lung]
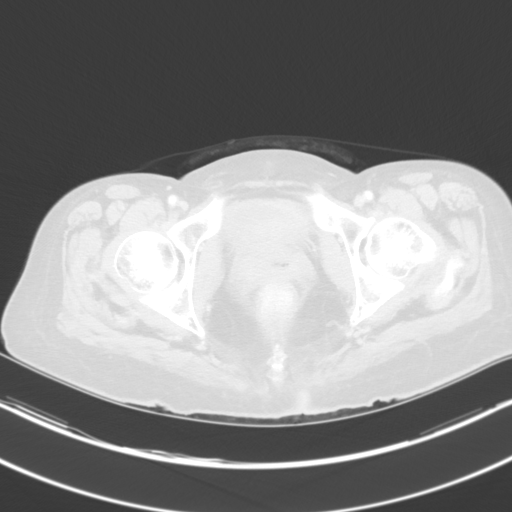
[im 26/126  lung]
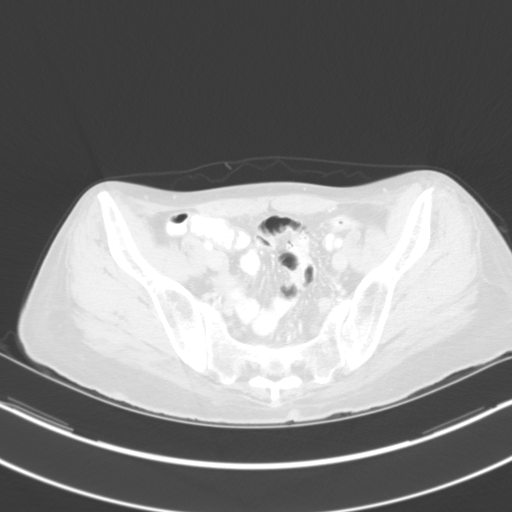
[im 38/126  lung]
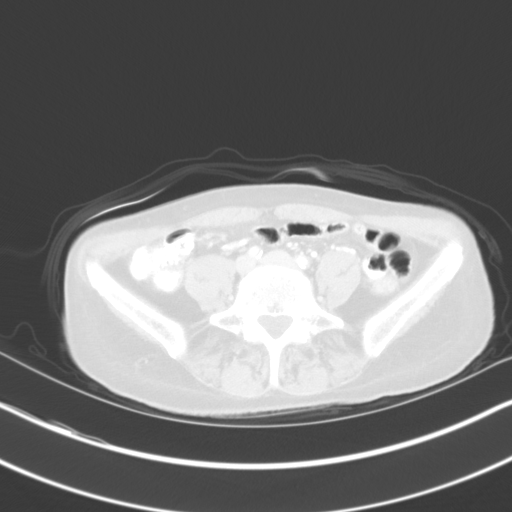
[im 51/126  lung]
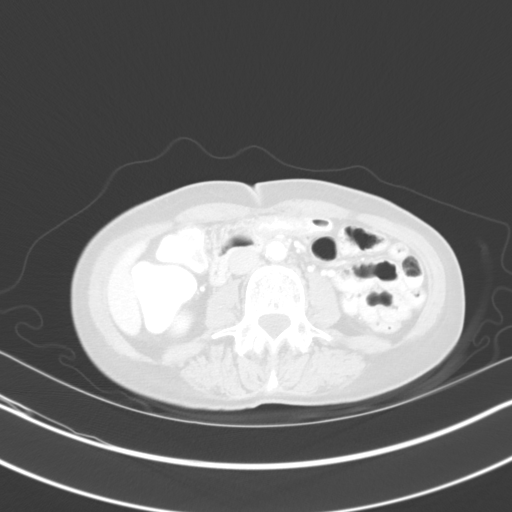
[im 62/126  mediastinal]
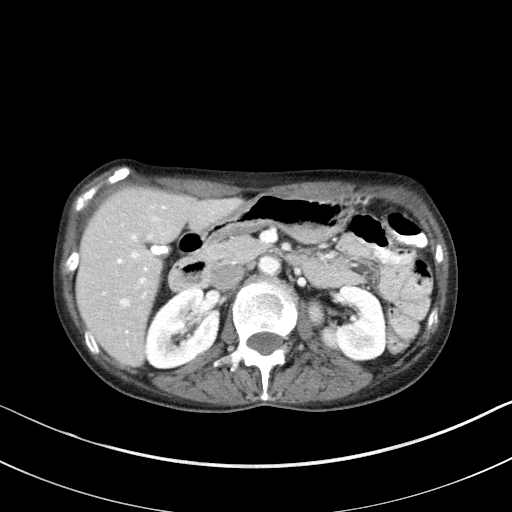
[im 62/126  lung]
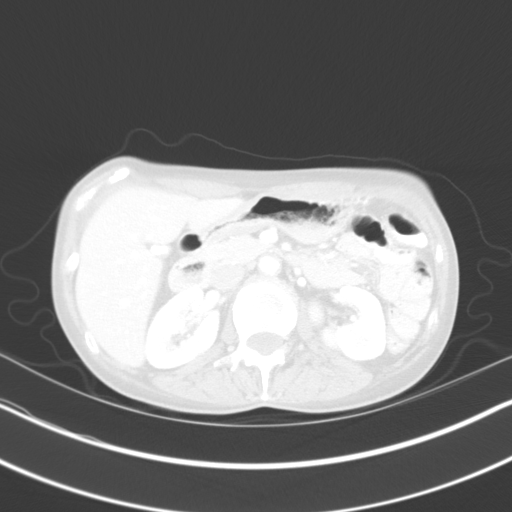
[im 63/126  lung]
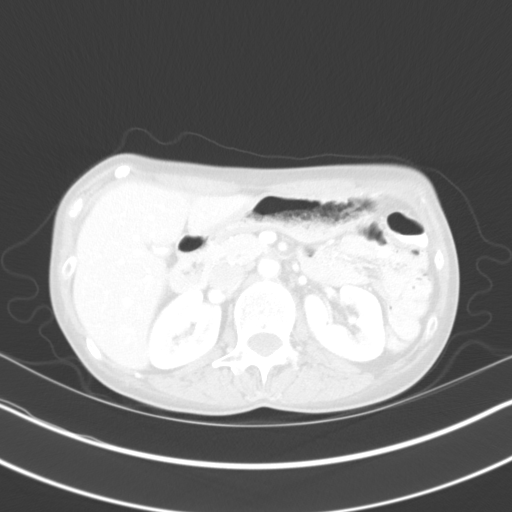
[im 76/126  lung]
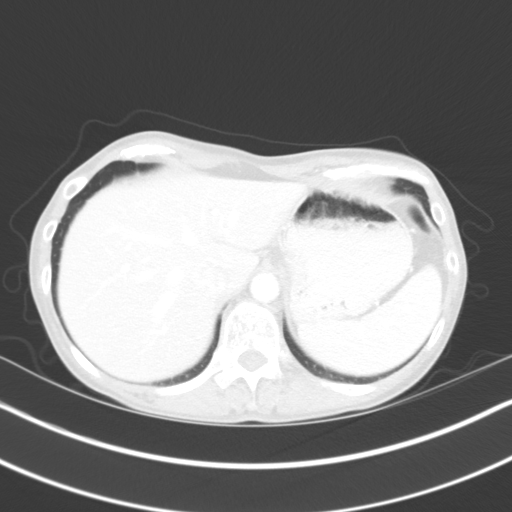
[im 88/126  lung]
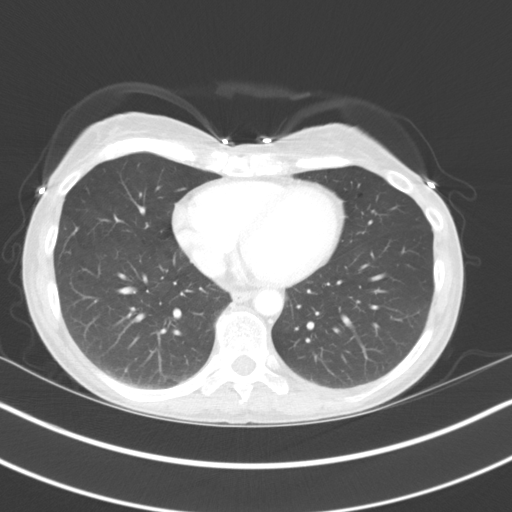
[im 101/126  mediastinal]
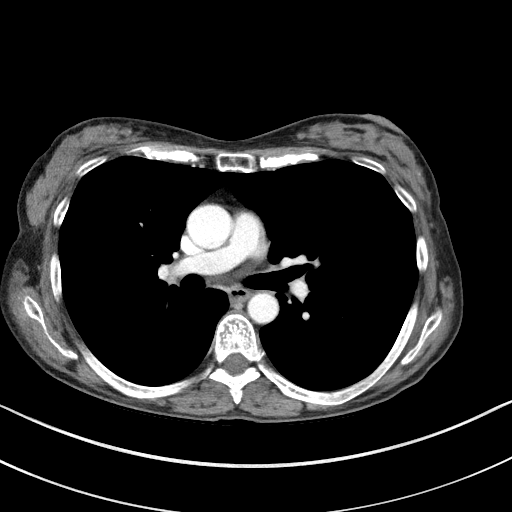
[im 101/126  lung]
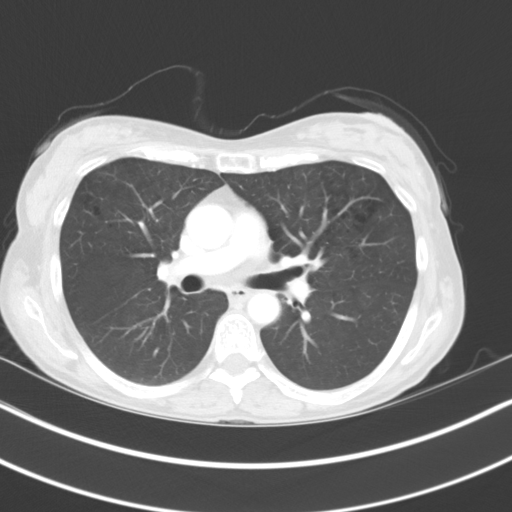
[im 113/126  lung]
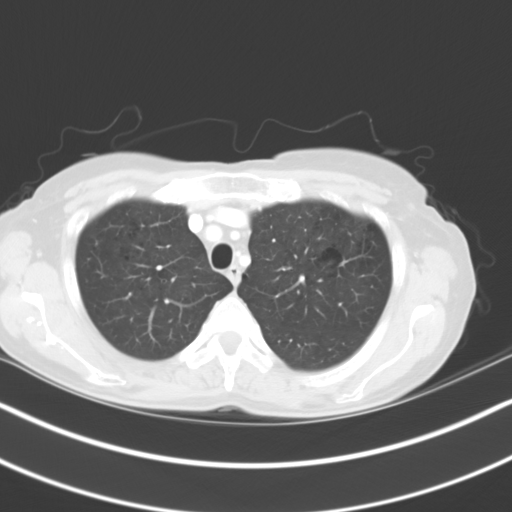

[Series 4: lung · axial · 0.64mm/px · z∈[-707,-619]mm · 3 of 145 slices shown]
[im 12/145  lung]
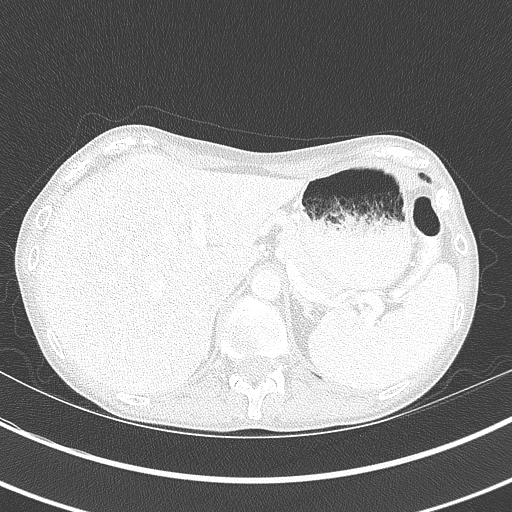
[im 34/145  lung]
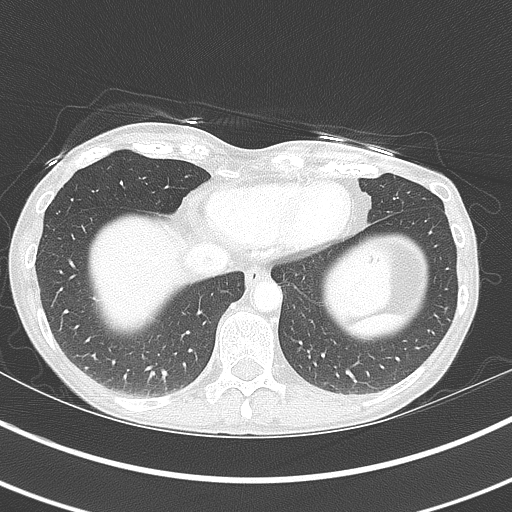
[im 56/145  lung]
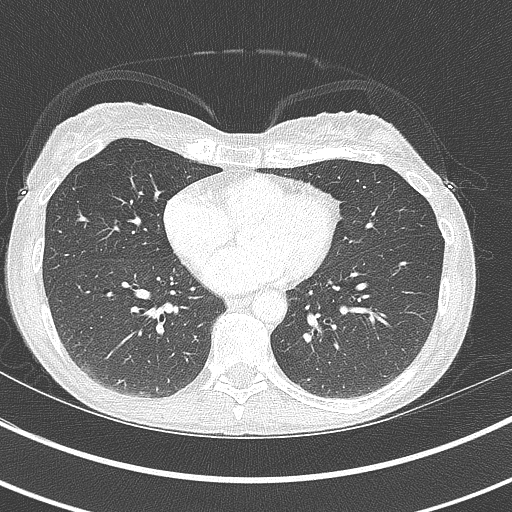

[13 of 31 positions shown; findings below may reference images not displayed]

FINDINGS: CT CHEST FINDINGS

Cardiovascular: There is no thoracic aortic aneurysm or dissection.
There are foci of atherosclerotic calcification in the aorta. The
visualized great vessels appear normal. There are scattered foci of
coronary artery calcification. Pericardium is not appreciably
thickened. There is no major vessel pulmonary embolus.

Mediastinum/Nodes: There are small, subcentimeter nodular opacities
in the thyroid consistent with multinodular goiter. There is no
dominant thyroid mass. There is no appreciable thoracic adenopathy.

Lungs/Pleura: There is centrilobular emphysematous change. There are
scattered bullae throughout the lungs bilaterally. There is no
parenchymal lung mass or nodular lesion. There is no parenchymal
lung edema or consolidation. There is no pleural effusion or
thickening.

Musculoskeletal: There are no evident blastic or lytic bone lesions.

CT ABDOMEN PELVIS FINDINGS

Hepatobiliary: No focal liver lesions are evident. There is
cholelithiasis with areas of calcification in the gallbladder wall.
There is no pericholecystic fluid. There is no appreciable biliary
duct dilatation.

Pancreas: There is no pancreatic mass or inflammatory focus
appreciable.

Spleen: No splenic lesions are evident.

Adrenals/Urinary Tract: There is a right adrenal adenoma medially
measuring 1.0 x 0.6 cm. Adrenals otherwise appear normal
bilaterally. Kidneys bilaterally show no evident mass or
hydronephrosis on either side. There is no renal or ureteral
calculus on either side. Urinary bladder is decompressed.

Stomach/Bowel: There is wall thickening in the proximal transverse
colon. There is concern for potential neoplasm within the proximal
transverse colon given an abrupt contour change in this area
compared to the areas proximal and distal to this region. This area
of grade is concern for potential lesion in the transverse colon
measures approximately 3 cm in length. There is no surrounding
mesenteric thickening. No other evidence of bowel wall thickening.
No bowel obstruction is evident. There is no free air or portal
venous air. Note that there are scattered sigmoid diverticula
without diverticulitis.

Vascular/Lymphatic: There is atherosclerotic calcification in the
aorta and common iliac arteries. Foci of calcification are also
noted in the proximal hypogastric arteries. Calcification in the
proximal common iliac arteries is extensive with what is felt to be
hemodynamically significant obstruction in these areas, particularly
on the left. No adenopathy is evident in the abdomen or pelvis.

Reproductive: The uterus is anteverted. No pelvic mass or pelvic
fluid collection is evident. Cervix is mildly prominent by CT.

Other: Appendix is not appreciable. No periappendiceal region
inflammation evident. No ascites or abscess evident in the abdomen
or pelvis.

Musculoskeletal: There are no blastic or lytic bone lesions. Bones
appear somewhat osteoporotic. There is no intramuscular or abdominal
wall lesion.
IMPRESSION: Chest CT: No parenchymal lung mass or adenopathy. Underlying
centrilobular emphysema with multiple bullous lesions in the lungs.
No pleural effusions. Foci of atherosclerotic calcification in the
aorta as well as foci of coronary artery calcification. Multinodular
goiter without dominant lesion.

CT abdomen and pelvis: There is an area of narrowing in the proximal
transverse colon consistent for potential neoplasm. Direct
visualization of this area advised.

Mild prominence of the cervix. Advise appropriate clinical
evaluation and Pap smear if these examinations have not been
performed recently.

Evidence of porcelain gallbladder with calcification gallbladder
wall. There is also cholelithiasis. It should be noted that there is
an increased incidence of gallbladder carcinoma associated with
porcelain gallbladder.

There is aortoiliac atherosclerosis.

No adenopathy.  No liver lesions evident.

No bowel obstruction. No abscess. No periappendiceal region
inflammation. No renal or ureteral calculi. No hydronephrosis.

These results will be called to the ordering clinician or
representative by the Radiologist Assistant, and communication
documented in the PACS or zVision Dashboard.

## 2018-09-08 ENCOUNTER — Other Ambulatory Visit: Payer: Self-pay | Admitting: *Deleted

## 2018-09-08 MED ORDER — PHENYTOIN SODIUM EXTENDED 100 MG PO CAPS: 300.0000 mg | ORAL_CAPSULE | Freq: Every day | ORAL | 0 refills | Status: DC

## 2018-09-13 ENCOUNTER — Ambulatory Visit: Payer: Medicare Other | Admitting: Psychology

## 2018-09-14 ENCOUNTER — Telehealth: Payer: Self-pay

## 2018-09-14 NOTE — Telephone Encounter (Signed)
Copied from CRM [id number]. Topic: Referral - Status >> Sep 14, 2018 11:17 AM Ricarda Frame D wrote: 09/14/2018 spoke with client about community resources for in-home aide and medication assistance and transportation. Emailed resources per patient request. Gave client my name and phone number if any further assistance is needed.MA

## 2018-09-15 ENCOUNTER — Telehealth: Payer: Self-pay

## 2018-09-15 NOTE — Telephone Encounter (Signed)
Copied from CRM [id number]. Topic: Referral - Status >> Sep 15, 2018 11:22 AM Kim Gallagher D wrote: 09/15/2018 spoke with client, she has received her emailed resources and will call me if she has any questions or any other needs.MA

## 2018-10-13 ENCOUNTER — Other Ambulatory Visit: Payer: Self-pay | Admitting: *Deleted

## 2018-10-13 MED ORDER — BUSPIRONE HCL 15 MG PO TABS: ORAL_TABLET | ORAL | 1 refills | Status: DC

## 2019-01-18 ENCOUNTER — Other Ambulatory Visit: Payer: Self-pay | Admitting: Family Medicine

## 2019-01-18 NOTE — Telephone Encounter (Signed)
Requested medications are due for refill today?  Yes  Requested medications are on the active medication list? Yes   Last refill 10/13/2018, #60, 1 refill   Future visit scheduled?  No  Notes to clinic:  Advised patient that she is due for follow up appointment.  Offered to transfer her call to Shelba Flake to schedule appointment.  Patient states she has moved to [place] and plans to start seeing Dr. Sanda Klein.  Advised her I will have to forward the refill request to the [facility] office as this is where it was last refilled.  Recommended that she call to set up appointment with new PCP as soon as possible.  Patient agreeable.   Requested Prescriptions  Pending Prescriptions Disp Refills   busPIRone (BUSPAR) 15 MG tablet [Pharmacy Med Name: BUSPIRONE HCL 15 MG TAB] 60 tablet 1    Sig: TAKE ONE TABLET BY MOUTH TWO TIMES DAILY     Psychiatry: Anxiolytics/Hypnotics - Non-controlled Failed - 01/18/2019 10:43 AM      Failed - Valid encounter within last 6 months    Recent Outpatient Visits          6 months ago Major depressive disorder, recurrent episode, in partial remission with anxious distress ([place])   Dulce, Ashleigh N, NP   1 year ago [facility] Lada, Satira Anis, MD   2 years ago Porcelain gallbladder   [facility] Lada, Satira Anis, MD   2 years ago Alkaline phosphatase elevation   [facility] Lada, Satira Anis, MD   2 years ago Abnormal weight loss   [facility] Lada, Satira Anis, MD

## 2019-02-22 ENCOUNTER — Telehealth: Payer: Self-pay | Admitting: Family Medicine

## 2019-02-22 NOTE — Telephone Encounter (Signed)
No longer pt here

## 2019-02-22 NOTE — Telephone Encounter (Signed)
Medication Refill - Medication:  venlafaxine XR (EFFEXOR-XR) 50 MG 24 hr capsule  Has the patient contacted their pharmacy? Yes advised to call office. Patient stated she has been Dr.Lada and is wanting it to be filled if possible.   Preferred Pharmacy (with phone number or street name):  [place] [place] - [place] [phone] (Phone) [phone] (Fax)   Agent: Please be advised that RX refills may take up to 3 business days. We ask that you follow-up with your pharmacy.

## 2019-05-17 ENCOUNTER — Ambulatory Visit: Payer: Medicare Other | Admitting: Family Medicine

## 2019-06-01 ENCOUNTER — Other Ambulatory Visit: Payer: Self-pay

## 2019-06-01 ENCOUNTER — Ambulatory Visit (INDEPENDENT_AMBULATORY_CARE_PROVIDER_SITE_OTHER): Payer: Medicare Other | Admitting: Family Medicine

## 2019-06-01 DIAGNOSIS — Z5329 Procedure and treatment not carried out because of patient's decision for other reasons: Secondary | ICD-10-CM

## 2019-06-01 DIAGNOSIS — Z91199 Patient's noncompliance with other medical treatment and regimen due to unspecified reason: Secondary | ICD-10-CM

## 2019-06-01 NOTE — Progress Notes (Signed)
Pt was late for new pt appt today virtual She was a patient in the past, but transferred care to [place] with Caesar Chestnut NPin Feb 2020 due to a move to [place].   She did ask front office about med refills since she will run out prior to it being rescheduled, I cannot tell from chart review what meds she would still be on, it appears per med list and past rx that she would be out of some meds already. I tried to call the pt to discuss with her, and I left a VM.  Delsa Grana, PA-C

## 2019-06-13 ENCOUNTER — Telehealth: Payer: Medicare Other | Admitting: Family Medicine

## 2019-06-20 ENCOUNTER — Ambulatory Visit (INDEPENDENT_AMBULATORY_CARE_PROVIDER_SITE_OTHER): Payer: Medicare Other | Admitting: Family Medicine

## 2019-06-20 ENCOUNTER — Other Ambulatory Visit: Payer: Self-pay

## 2019-06-20 DIAGNOSIS — Z5329 Procedure and treatment not carried out because of patient's decision for other reasons: Secondary | ICD-10-CM

## 2019-06-21 NOTE — Progress Notes (Signed)
No show

## 2019-06-22 ENCOUNTER — Encounter: Payer: Self-pay | Admitting: Family Medicine

## 2019-08-16 ENCOUNTER — Telehealth: Payer: Self-pay

## 2019-08-16 NOTE — Telephone Encounter (Signed)
[facility] Night - Client Nonclinical Telephone Record AccessNurse Client [facility] Night - Client Client Site [facility] [place] - Night Physician AA - PHYSICIAN, Crissie Figures- MD Contact Type Call Who Is Calling Patient / Member / Family / Caregiver Caller Name Marda Breidenbach Caller Phone Number [phone] Patient Name Kim Gallagher Patient DOB 1965/11/03 Call Type Message Only Information Provided Reason for Call Request for General Office Information Initial Comment Caller states she has an appointment on 4/6, she would like to verify the address. Additional Comment Office hours and address provided. Disp. Time Disposition Final User 08/15/2019 6:24:34 PM General Information Provided Yes PerezMezquite, Esther Call Closed By: Salley Scarlet Transaction Date/Time: 08/15/2019 6:21:22 PM (ET)

## 2019-08-16 NOTE — Telephone Encounter (Signed)
Called and gave pt the address to the office.

## 2019-09-06 ENCOUNTER — Encounter: Payer: Self-pay | Admitting: Primary Care

## 2019-09-06 ENCOUNTER — Ambulatory Visit (INDEPENDENT_AMBULATORY_CARE_PROVIDER_SITE_OTHER): Payer: Medicare Other | Admitting: Primary Care

## 2019-09-06 ENCOUNTER — Other Ambulatory Visit: Payer: Self-pay

## 2019-09-06 VITALS — BP 124/84 | HR 92 | Temp 97.1°F | Ht 65.0 in | Wt 180.5 lb

## 2019-09-06 DIAGNOSIS — F411 Generalized anxiety disorder: Secondary | ICD-10-CM

## 2019-09-06 DIAGNOSIS — Z8673 Personal history of transient ischemic attack (TIA), and cerebral infarction without residual deficits: Secondary | ICD-10-CM

## 2019-09-06 DIAGNOSIS — E785 Hyperlipidemia, unspecified: Secondary | ICD-10-CM

## 2019-09-06 DIAGNOSIS — R748 Abnormal levels of other serum enzymes: Secondary | ICD-10-CM

## 2019-09-06 DIAGNOSIS — F418 Other specified anxiety disorders: Secondary | ICD-10-CM

## 2019-09-06 DIAGNOSIS — G40909 Epilepsy, unspecified, not intractable, without status epilepticus: Secondary | ICD-10-CM

## 2019-09-06 DIAGNOSIS — F3341 Major depressive disorder, recurrent, in partial remission: Secondary | ICD-10-CM | POA: Diagnosis not present

## 2019-09-06 MED ORDER — BUSPIRONE HCL 15 MG PO TABS: ORAL_TABLET | ORAL | 1 refills | Status: DC

## 2019-09-06 MED ORDER — ESCITALOPRAM OXALATE 10 MG PO TABS: 10.0000 mg | ORAL_TABLET | Freq: Every day | ORAL | 1 refills | Status: DC

## 2019-09-06 NOTE — Assessment & Plan Note (Signed)
LDL goal should be <70 given stroke history, tobacco abuse, etc. Repeat lipids pending. May need to switch to high intensity statin.

## 2019-09-06 NOTE — Patient Instructions (Signed)
Stop by the lab prior to leaving today. I will notify you of your results once received.   I refilled your buspirone medication for anxiety.  Start taking escitalopram (Lexapro) 10 mg once daily for anxiety and depression. Start by taking 1/2 tablet daily for 6-8 days, then increase to 1 full tablet thereafter.  Schedule a follow up visit for 6 weeks for follow up.  It was a pleasure meeting you!

## 2019-09-06 NOTE — Assessment & Plan Note (Addendum)
Uncontrolled, no improvement with venlafaxine which also caused nausea. GAD 7 score of 16 today.   Rx for Lexapro 10 mg sent to pharmacy. Refill provided for Buspar. Patient is to take 1/2 tablet daily for 8 days, then advance to 1 full tablet thereafter. We discussed possible side effects of headache, GI upset, drowsiness, and SI/HI. If thoughts of SI/HI develop, we discussed to present to the emergency immediately. Patient verbalized understanding.   Follow up in 6 weeks for re-evaluation.

## 2019-09-06 NOTE — Assessment & Plan Note (Signed)
No recent follow up, last visit was in 2019, Dr. Malvin Johns. Recommended she contact her neurologist for a follow up visit for further refills of her medication.

## 2019-09-06 NOTE — Assessment & Plan Note (Signed)
Noted on prior labs, repeat CMP pending. History of cholecystectomy.

## 2019-09-06 NOTE — Assessment & Plan Note (Signed)
Diagnosed in 2008, not following with neurology. Compliant to seizure treatment and pravastatin. LDL goal of <70 given history of CVA. Repeat lipids pending.

## 2019-09-06 NOTE — Progress Notes (Signed)
Subjective:    Patient ID: Kim Gallagher, female    DOB: 1965-10-18, 54 y.o.   MRN: [id number]  HPI  This visit occurred during the SARS-CoV-2 public health emergency.  Safety protocols were in place, including screening questions prior to the visit, additional usage of staff PPE, and extensive cleaning of exam room while observing appropriate contact time as indicated for disinfecting solutions.   Kim Gallagher is a 54 year old female who presents today to establish care and discuss the problems mentioned below. Will obtain/review records.  1) CVA/Hyperlipidemia: Diagnosed in 2008, "mini stroke". She does have seizures as a residual from her stroke, last seizure was in 2016. Managed on pravastatin 40 mg daily. No recent lipid panel on file. LDL goal of <70 given stroke history.   2) Anxiety Disorder/MDD: Previously managed on venlafaxine XR 150 mg, but has not taken for about one year as it made her nauseated. Overall she didn't notice a difference in anxiety on venlafaxine. Has also tried Zoloft in the past but this became ineffective. She's been out of her Buspar for about one month, did well on this and would like a refill. She is interested in further treatment for anxiety and depression. Symptoms include feeling distracted, feeling nervous/anxious, mind racing thoughts, decreased concentration, feeling down/sad, feeling tired. GAD 7 score of 16 and PHQ 9 score of 22 today. Denies SI/HI.   3) Seizure Disorder: Diagnosed in 2008 after stroke, last seizure around 2016. Currently managed on Lamictal 150 mg BID for which she's taken since 2019. Previously following with Dr. Melrose Nakayama, last visit was in March of 2019, no visit since. She is compliant to her Lamictal 150 mg and is taking BID.   BP Readings from Last 3 Encounters:  09/06/19 124/84  07/16/18 130/88  05/07/17 98/64     Review of Systems  Constitutional: Positive for fatigue.  Eyes: Negative for visual disturbance.  Respiratory:  Negative for shortness of breath.   Cardiovascular: Negative for chest pain.  Neurological: Negative for seizures.  Psychiatric/Behavioral:       See HPI       Past Medical History:  Diagnosis Date  . Anxiety   . Chronic kidney disease   . Chronic major depressive disorder   . Dysuria   . Heavy smoker   . History of TIA (transient ischemic attack)   . Hyperlipidemia   . Hyperlipidemia, acquired   . Seizures ([place])    r/t to stroke  . Stroke ([place]) 11/16/2006   left sided weakness  . Urinary incontinence in female      Social History   Socioeconomic History  . Marital status: Divorced    Spouse name: Not on file  . Number of children: Not on file  . Years of education: Not on file  . Highest education level: Not on file  Occupational History  . Not on file  Tobacco Use  . Smoking status: Current Every Day Smoker    Packs/day: 1.00    Types: Cigarettes  . Smokeless tobacco: Never Used  Substance and Sexual Activity  . Alcohol use: No    Alcohol/week: 0.0 standard drinks  . Drug use: Yes    Frequency: 1.0 times per week    Types: Marijuana  . Sexual activity: Never    Birth control/protection: Post-menopausal  Other Topics Concern  . Not on file  Social History Narrative  . Not on file   Social Determinants of Health   Financial Resource Strain:   .  Difficulty of Paying Living Expenses:   Food Insecurity:   . Worried About Programme researcher, broadcasting/film/video in the Last Year:   . Barista in the Last Year:   Transportation Needs:   . Freight forwarder (Medical):   Marland Kitchen Lack of Transportation (Non-Medical):   Physical Activity:   . Days of Exercise per Week:   . Minutes of Exercise per Session:   Stress:   . Feeling of Stress :   Social Connections:   . Frequency of Communication with Friends and Family:   . Frequency of Social Gatherings with Friends and Family:   . Attends Religious Services:   . Active Member of Clubs or Organizations:   . Attends Occupational hygienist Meetings:   Marland Kitchen Marital Status:   Intimate Partner Violence:   . Fear of Current or Ex-Partner:   . Emotionally Abused:   Marland Kitchen Physically Abused:   . Sexually Abused:     Past Surgical History:  Procedure Laterality Date  . CHOLECYSTECTOMY N/A 07/10/2016   Procedure: LAPAROSCOPIC CHOLECYSTECTOMY WITH INTRAOPERATIVE CHOLANGIOGRAM;  Surgeon: Kieth Brightly, MD;  Location: [facility] ORS;  Service: General;  Laterality: N/A;  . COLONOSCOPY WITH PROPOFOL N/A 05/13/2016   Procedure: COLONOSCOPY WITH PROPOFOL;  Surgeon: Kieth Brightly, MD;  Location: [facility] END[facility]COPY;  Service: Endoscopy;  Laterality: N/A;  . COLONOSCOPY WITH PROPOFOL N/A 05/21/2016   Procedure: COLONOSCOPY WITH PROPOFOL;  Surgeon: Kieth Brightly, MD;  Location: [facility] ENDOSCOPY;  Service: Endoscopy;  Laterality: N/A;  . TUBAL LIGATION      Family History  Problem Relation Age of Onset  . Kidney disease Mother   . Hypertension Mother   . Depression Mother   . Diabetes Mother   . Early death Mother   . Drug abuse Mother   . Heart disease Mother   . Hyperlipidemia Mother   . Stroke Father   . Hypertension Father   . Cancer Father        thyroid  . Diabetes Father   . Early death Father   . Drug abuse Father   . Diabetes Sister   . Early death Sister   . Drug abuse Sister   . Hyperlipidemia Sister   . Diabetes Brother   . Depression Maternal Grandmother   . Alcohol abuse Maternal Grandfather   . Cancer Maternal Grandfather   . Cancer Paternal Grandfather     Allergies  Allergen Reactions  . Sulfa Antibiotics Rash    Current Outpatient Medications on File Prior to Visit  Medication Sig Dispense Refill  . aspirin 325 MG tablet Take 325 mg by mouth daily.    . Cholecalciferol (VITAMIN D3 PO) Take 1 each by mouth daily.    Marland Kitchen ibuprofen (ADVIL,MOTRIN) 200 MG tablet Take 600 mg by mouth every 6 (six) hours as needed for mild pain or moderate pain.    Marland Kitchen lamoTRIgine (LAMICTAL) 150 MG tablet  Take 1 tablet by mouth daily.     . pravastatin (PRAVACHOL) 40 MG tablet Take 1 tablet (40 mg total) by mouth at bedtime. 90 tablet 0   No current facility-administered medications on file prior to visit.    BP 124/84   Pulse 92   Temp (!) 97.1 F (36.2 C) (Temporal)   Ht 5\' 5"  (1.651 m)   Wt 180 lb 8 oz (81.9 kg)   SpO2 98%   BMI 30.04 kg/m  '  Objective:   Physical Exam  Constitutional: She appears well-nourished.  Cardiovascular: Normal rate and regular rhythm.  Respiratory: Effort normal and breath sounds normal.  Musculoskeletal:     Cervical back: Neck supple.  Skin: Skin is warm and dry.  Psychiatric: She has a normal mood and affect.           Assessment & Plan:

## 2019-09-06 NOTE — Assessment & Plan Note (Signed)
Uncontrolled, no improvement with venlafaxine which also caused nausea. PHQ 9 score of 22 today, denies SI/HI.   Rx for Lexapro 10 mg sent to pharmacy.  Patient is to take 1/2 tablet daily for 8 days, then advance to 1 full tablet thereafter. We discussed possible side effects of headache, GI upset, drowsiness, and SI/HI. If thoughts of SI/HI develop, we discussed to present to the emergency immediately. Patient verbalized understanding.   Follow up in 6 weeks for re-evaluation.

## 2019-09-07 ENCOUNTER — Other Ambulatory Visit: Payer: Self-pay | Admitting: Primary Care

## 2019-09-07 ENCOUNTER — Encounter: Payer: Self-pay | Admitting: Primary Care

## 2019-09-07 DIAGNOSIS — E785 Hyperlipidemia, unspecified: Secondary | ICD-10-CM

## 2019-09-07 LAB — COMPREHENSIVE METABOLIC PANEL
ALT: 11 U/L (ref 0–35)
AST: 15 U/L (ref 0–37)
Albumin: 4.2 g/dL (ref 3.5–5.2)
Alkaline Phosphatase: 122 U/L — ABNORMAL HIGH (ref 39–117)
BUN: 12 mg/dL (ref 6–23)
CO2: 26 mEq/L (ref 19–32)
Calcium: 9.2 mg/dL (ref 8.4–10.5)
Chloride: 103 mEq/L (ref 96–112)
Creatinine, Ser: 0.93 mg/dL (ref 0.40–1.20)
GFR: 62.45 mL/min (ref >60.00)
Glucose, Bld: 79 mg/dL (ref 70–99)
Potassium: 4.4 mEq/L (ref 3.5–5.1)
Sodium: 138 mEq/L (ref 135–145)
Total Bilirubin: 0.4 mg/dL (ref 0.2–1.2)
Total Protein: 6.2 g/dL (ref 6.0–8.3)

## 2019-09-07 LAB — LIPID PANEL
Cholesterol: 213 mg/dL — ABNORMAL HIGH (ref 0–200)
HDL: 46.9 mg/dL (ref >39.00)
LDL Cholesterol: 133 mg/dL — ABNORMAL HIGH (ref 0–99)
NonHDL: 165.77
Total CHOL/HDL Ratio: 5
Triglycerides: 166 mg/dL — ABNORMAL HIGH (ref 0.0–149.0)
VLDL: 33.2 mg/dL (ref 0.0–40.0)

## 2019-09-07 MED ORDER — ROSUVASTATIN CALCIUM 10 MG PO TABS: 10.0000 mg | ORAL_TABLET | Freq: Every evening | ORAL | 3 refills | Status: DC

## 2019-09-29 ENCOUNTER — Other Ambulatory Visit: Payer: Self-pay

## 2019-09-29 ENCOUNTER — Ambulatory Visit
Admission: EM | Admit: 2019-09-29 | Discharge: 2019-09-29 | Disposition: A | Discharge status: Home / Self Care | Payer: Medicare Other | Attending: Emergency Medicine | Admitting: Emergency Medicine

## 2019-09-29 ENCOUNTER — Encounter: Payer: Self-pay | Admitting: Emergency Medicine

## 2019-09-29 DIAGNOSIS — H1031 Unspecified acute conjunctivitis, right eye: Secondary | ICD-10-CM | POA: Diagnosis not present

## 2019-09-29 MED ORDER — POLYMYXIN B-TRIMETHOPRIM 10000-0.1 UNIT/ML-% OP SOLN: 1.0000 [drp] | Freq: Four times a day (QID) | OPHTHALMIC | 0 refills | Status: AC

## 2019-09-29 NOTE — Discharge Instructions (Addendum)
Use the antibiotic eyedrops as prescribed.    Follow-up with your eye doctor for a recheck in 1 to 2 days if your symptoms are not improving.    Go to the emergency department if you have acute eye pain or changes in your vision.    

## 2019-09-29 NOTE — ED Provider Notes (Signed)
Kim Gallagher    CSN: [id number] Arrival date & time: 09/29/19  1716      History   Chief Complaint Chief Complaint  Patient presents with  . Conjunctivitis    HPI Kim Gallagher is a 54 y.o. female.   Patient presents with 3-day history of right eye itching, redness, drainage, crusting, and photophobia.  No acute eye pain or changes in vision.  No known injury.  No treatments attempted at home.  She denies lesions, fever, chills, congestion, sore throat, shortness of breath, vomiting, diarrhea, or other symptoms.    The history is provided by the patient.    Past Medical History:  Diagnosis Date  . Anxiety   . Breast lump or mass 04/29/2016  . Chronic kidney disease   . Chronic major depressive disorder   . Dysuria   . Emphysema lung (HCC)   . Heavy smoker   . History of TIA (transient ischemic attack)   . Hyperlipidemia   . Hyperlipidemia, acquired   . Personality change due to stroke 10/05/2015  . Porcelain gallbladder 05/07/2016   CT scan 2017  . Seizures (HCC)    r/t to stroke  . Stroke (HCC) 11/16/2006   left sided weakness  . Urinary incontinence in female   . UTI (urinary tract infection)   . Vaginal mass 04/29/2016   Lateral right wall near cervix    Patient Active Problem List   Diagnosis Date Noted  . Vitamin B12 deficiency 06/10/2016  . Goiter, nodular 06/10/2016  . Rhinitis medicamentosa 05/11/2016  . Colonic thickening 05/07/2016  . GAD (generalized anxiety disorder) 05/07/2016  . Screening for cervical cancer 04/29/2016  . Hx of abnormal cervical Pap smear 04/29/2016  . Healthcare maintenance 03/18/2016  . Marijuana smoker 11/12/2015  . Vitamin D deficiency 11/12/2015  . Alkaline phosphatase elevation 11/02/2015  . Hyperlipidemia LDL goal <70 04/05/2015  . Heavy tobacco smoker 04/05/2015  . Major depressive disorder, recurrent episode, in partial remission with anxious distress (HCC) 04/05/2015  . Seizure disorder (HCC) 04/05/2015  .  History of stroke 02/27/2015    Past Surgical History:  Procedure Laterality Date  . CHOLECYSTECTOMY N/A 07/10/2016   Procedure: LAPAROSCOPIC CHOLECYSTECTOMY WITH INTRAOPERATIVE CHOLANGIOGRAM;  Surgeon: Kieth Brightly, MD;  Location: [facility];  Service: General;  Laterality: N/A;  . COLONOSCOPY WITH PROPOFOL N/A 05/13/2016   Procedure: COLONOSCOPY WITH PROPOFOL;  Surgeon: Kieth Brightly, MD;  Location: [facility];  Service: Endoscopy;  Laterality: N/A;  . COLONOSCOPY WITH PROPOFOL N/A 05/21/2016   Procedure: COLONOSCOPY WITH PROPOFOL;  Surgeon: Kieth Brightly, MD;  Location: [facility];  Service: Endoscopy;  Laterality: N/A;  . TUBAL LIGATION      OB History   No obstetric history on file.      Home Medications    Prior to Admission medications   Medication Sig Start Date End Date Taking? Authorizing Provider  aspirin 325 MG tablet Take 325 mg by mouth daily.   Yes [provider]  busPIRone (BUSPAR) 15 MG tablet TAKE ONE TABLET BY MOUTH TWO TIMES DAILY for anxiety. 09/06/19  Yes Doreene Nest, NP  Cholecalciferol (VITAMIN D3 PO) Take 1 each by mouth daily.   Yes [provider]  escitalopram (LEXAPRO) 10 MG tablet Take 1 tablet (10 mg total) by mouth daily. For anxiety and depression. 09/06/19  Yes Doreene Nest, NP  ibuprofen (ADVIL,MOTRIN) 200 MG tablet Take 600 mg by mouth every 6 (six) hours as needed for mild pain  or moderate pain.   Yes [provider]  lamoTRIgine (LAMICTAL) 150 MG tablet Take 1 tablet by mouth daily.  02/02/18  Yes [provider]  rosuvastatin (CRESTOR) 10 MG tablet Take 1 tablet (10 mg total) by mouth every evening. For cholesterol. 09/07/19  Yes Doreene Nest, NP  trimethoprim-polymyxin b (POLYTRIM) ophthalmic solution Place 1 drop into both eyes 4 (four) times daily for 7 days. 09/29/19 10/06/19  Mickie Bail, NP    Family History Family History  Problem Relation Age of Onset  .  Kidney disease Mother   . Hypertension Mother   . Depression Mother   . Diabetes Mother   . Early death Mother   . Drug abuse Mother   . Heart disease Mother   . Hyperlipidemia Mother   . Stroke Father   . Hypertension Father   . Cancer Father        thyroid  . Diabetes Father   . Early death Father   . Drug abuse Father   . Diabetes Sister   . Early death Sister   . Drug abuse Sister   . Hyperlipidemia Sister   . Diabetes Brother   . Depression Maternal Grandmother   . Alcohol abuse Maternal Grandfather   . Cancer Maternal Grandfather   . Cancer Paternal Grandfather     Social History Social History   Tobacco Use  . Smoking status: Current Every Day Smoker    Packs/day: 1.00    Years: 40.00    Pack years: 40.00    Types: Cigarettes  . Smokeless tobacco: Never Used  Substance Use Topics  . Alcohol use: Yes    Alcohol/week: 0.0 standard drinks    Comment: occasional  . Drug use: Yes    Frequency: 1.0 times per week    Types: Marijuana     Allergies   Sulfa antibiotics   Review of Systems Review of Systems  Constitutional: Negative for chills and fever.  HENT: Negative for ear pain and sore throat.   Eyes: Positive for photophobia, redness and itching. Negative for pain, discharge and visual disturbance.  Respiratory: Negative for cough and shortness of breath.   Cardiovascular: Negative for chest pain and palpitations.  Gastrointestinal: Negative for abdominal pain and vomiting.  Genitourinary: Negative for dysuria and hematuria.  Musculoskeletal: Negative for arthralgias and back pain.  Skin: Negative for color change and rash.  Neurological: Negative for seizures and syncope.  All other systems reviewed and are negative.    Physical Exam Triage Vital Signs ED Triage Vitals  Enc Vitals Group     BP      Pulse      Resp      Temp      Temp src      SpO2      Weight      Height      Head Circumference      Peak Flow      Pain Score       Pain Loc      Pain Edu?      Excl. in GC?    No data found.  Updated Vital Signs BP 106/66 (BP Location: Left Arm)   Pulse 92   Temp 98.2 F (36.8 C) (Oral)   Resp 20   Ht 5\' 5"  (1.651 m)   Wt 180 lb (81.6 kg)   SpO2 96%   BMI 29.95 kg/m   Visual Acuity Right Eye Distance: 20/50 Left Eye Distance: 20/40  Bilateral Distance: 20/40  Right Eye Near:   Left Eye Near:    Bilateral Near:     Physical Exam Vitals and nursing note reviewed.  Constitutional:      General: She is not in acute distress.    Appearance: She is well-developed.  HENT:     Head: Normocephalic and atraumatic.     Right Ear: Tympanic membrane normal.     Left Ear: Tympanic membrane normal.     Nose: Nose normal.     Mouth/Throat:     Mouth: Mucous membranes are moist.     Pharynx: Oropharynx is clear.  Eyes:     General: Lids are normal. Vision grossly intact.     Extraocular Movements: Extraocular movements intact.     Conjunctiva/sclera:     Right eye: Right conjunctiva is injected.     Left eye: Left conjunctiva is not injected.  Cardiovascular:     Rate and Rhythm: Normal rate and regular rhythm.     Heart sounds: No murmur.  Pulmonary:     Effort: Pulmonary effort is normal. No respiratory distress.     Breath sounds: Normal breath sounds.  Abdominal:     Palpations: Abdomen is soft.     Tenderness: There is no abdominal tenderness. There is no guarding or rebound.  Musculoskeletal:     Cervical back: Neck supple.  Skin:    General: Skin is warm and dry.     Findings: No rash.  Neurological:     General: No focal deficit present.     Mental Status: She is alert and oriented to person, place, and time.  Psychiatric:        Mood and Affect: Mood normal.        Behavior: Behavior normal.      UC Treatments / Results  Labs (all labs ordered are listed, but only abnormal results are displayed) Labs Reviewed - No data to display  EKG   Radiology No results  found.  Procedures Procedures (including critical care time)  Medications Ordered in UC Medications - No data to display  Initial Impression / Assessment and Plan / UC Course  I have reviewed the triage vital signs and the nursing notes.  Pertinent labs & imaging results that were available during my care of the patient were reviewed by me and considered in my medical decision making (see chart for details).   Acute bacterial conjunctivitis of the right eye.  Treating with Polytrim eyedrops.  Instructed patient to follow-up with her eye care if her symptoms or not improving.  Instructed her to go to the ED if she has acute eye pain or changes in her vision.  Patient agrees to plan of care.     Final Clinical Impressions(s) / UC Diagnoses   Final diagnoses:  Acute bacterial conjunctivitis of right eye     Discharge Instructions     Use the antibiotic eyedrops as prescribed.    Follow-up with your eye doctor for a recheck in 1 to 2 days if your symptoms are not improving.    Go to the emergency department if you have acute eye pain or changes in your vision.       ED Prescriptions    Medication Sig Dispense Auth. Provider   trimethoprim-polymyxin b (POLYTRIM) ophthalmic solution Place 1 drop into both eyes 4 (four) times daily for 7 days. 10 mL Mickie Bail, NP     PDMP not reviewed this encounter.   Wendee Beavers  H, NP 09/29/19 1743

## 2019-09-29 NOTE — ED Triage Notes (Signed)
Patient in today c/o right eye pain, redness and itching x 3 days. Patient has not tried any OTC medications.

## 2019-10-18 ENCOUNTER — Encounter: Payer: Self-pay | Admitting: Primary Care

## 2019-10-18 ENCOUNTER — Ambulatory Visit (INDEPENDENT_AMBULATORY_CARE_PROVIDER_SITE_OTHER): Payer: Medicare Other | Admitting: Primary Care

## 2019-10-18 ENCOUNTER — Other Ambulatory Visit: Payer: Self-pay

## 2019-10-18 VITALS — BP 130/82 | HR 117 | Temp 96.4°F | Ht 65.0 in | Wt 185.0 lb

## 2019-10-18 DIAGNOSIS — F418 Other specified anxiety disorders: Secondary | ICD-10-CM

## 2019-10-18 DIAGNOSIS — F3341 Major depressive disorder, recurrent, in partial remission: Secondary | ICD-10-CM | POA: Diagnosis not present

## 2019-10-18 DIAGNOSIS — E785 Hyperlipidemia, unspecified: Secondary | ICD-10-CM | POA: Diagnosis not present

## 2019-10-18 DIAGNOSIS — F411 Generalized anxiety disorder: Secondary | ICD-10-CM

## 2019-10-18 DIAGNOSIS — G40909 Epilepsy, unspecified, not intractable, without status epilepticus: Secondary | ICD-10-CM

## 2019-10-18 MED ORDER — DULOXETINE HCL 20 MG PO CPEP: 20.0000 mg | ORAL_CAPSULE | Freq: Every day | ORAL | 1 refills | Status: DC

## 2019-10-18 NOTE — Progress Notes (Signed)
Subjective:    Patient ID: Kim Gallagher, female    DOB: [birth year], 54 y.o.   MRN: [id number]  HPI  This visit occurred during the SARS-CoV-2 public health emergency.  Safety protocols were in place, including screening questions prior to the visit, additional usage of staff PPE, and extensive cleaning of exam room while observing appropriate contact time as indicated for disinfecting solutions.   Kim Gallagher is a 54 year old female with a history of seizure disorder, MDD, hyperlipidemia, tobacco abuse, GAD, CVA who presents today for follow up of depression and anxiety.   She was last evaluated on 09/06/19 as a new patient, endorsed active symptoms of anxiety and depression including feeling distracted, nervous/anxoius, mind racing thoughts, etc. GAD 7 score of 16 and PHQ 9 score of 22. She experienced nausea with venlafaxine in the past, Zoloft became ineffective so we initiated Lexapro 10 mg.   Since her last visit she's not noticed any improvement on the Lexapro. She doesn't feel like she wants to do anything, feels sad/down.  She is taking Buspar which has helped with anxiety. She was once on Prozac in the past, this was not effective.   Review of Systems  Respiratory: Negative for shortness of breath.   Cardiovascular: Negative for chest pain.  Gastrointestinal: Negative for nausea.  Psychiatric/Behavioral:       See HPI       Past Medical History:  Diagnosis Date  . Anxiety   . Breast lump or mass 04/29/2016  . Chronic kidney disease   . Chronic major depressive disorder   . Dysuria   . Emphysema lung ([place])   . Heavy smoker   . History of TIA (transient ischemic attack)   . Hyperlipidemia   . Hyperlipidemia, acquired   . Personality change due to stroke 10/05/2015  . Porcelain gallbladder 05/07/2016   CT scan 2017  . Seizures ([place])    r/t to stroke  . Stroke ([place]) 11/16/2006   left sided weakness  . Urinary incontinence in female   . UTI (urinary tract infection)   .  Vaginal mass 04/29/2016   Lateral right wall near cervix     Social History   Socioeconomic History  . Marital status: Divorced    Spouse name: Not on file  . Number of children: Not on file  . Years of education: Not on file  . Highest education level: Not on file  Occupational History  . Not on file  Tobacco Use  . Smoking status: Current Every Day Smoker    Packs/day: 1.00    Years: 40.00    Pack years: 40.00    Types: Cigarettes  . Smokeless tobacco: Never Used  Substance and Sexual Activity  . Alcohol use: Yes    Alcohol/week: 0.0 standard drinks    Comment: occasional  . Drug use: Yes    Frequency: 1.0 times per week    Types: Marijuana  . Sexual activity: Never    Birth control/protection: Post-menopausal  Other Topics Concern  . Not on file  Social History Narrative  . Not on file   Social Determinants of Health   Financial Resource Strain:   . Difficulty of Paying Living Expenses:   Food Insecurity:   . Worried About Charity fundraiser in the Last Year:   . Arboriculturist in the Last Year:   Transportation Needs:   . Film/video editor (Medical):   Marland Kitchen Lack of Transportation (Non-Medical):   Physical  Activity:   . Days of Exercise per Week:   . Minutes of Exercise per Session:   Stress:   . Feeling of Stress :   Social Connections:   . Frequency of Communication with Friends and Family:   . Frequency of Social Gatherings with Friends and Family:   . Attends Religious Services:   . Active Member of Clubs or Organizations:   . Attends Banker Meetings:   Marland Kitchen Marital Status:   Intimate Partner Violence:   . Fear of Current or Ex-Partner:   . Emotionally Abused:   Marland Kitchen Physically Abused:   . Sexually Abused:     Past Surgical History:  Procedure Laterality Date  . CHOLECYSTECTOMY N/A 07/10/2016   Procedure: LAPAROSCOPIC CHOLECYSTECTOMY WITH INTRAOPERATIVE CHOLANGIOGRAM;  Surgeon: Kieth Brightly, MD;  Location: [facility];   Service: General;  Laterality: N/A;  . COLONOSCOPY WITH PROPOFOL N/A 05/13/2016   Procedure: COLONOSCOPY WITH PROPOFOL;  Surgeon: Kieth Brightly, MD;  Location: [facility];  Service: Endoscopy;  Laterality: N/A;  . COLONOSCOPY WITH PROPOFOL N/A 05/21/2016   Procedure: COLONOSCOPY WITH PROPOFOL;  Surgeon: Kieth Brightly, MD;  Location: [facility];  Service: Endoscopy;  Laterality: N/A;  . TUBAL LIGATION      Family History  Problem Relation Age of Onset  . Kidney disease Mother   . Hypertension Mother   . Depression Mother   . Diabetes Mother   . Early death Mother   . Drug abuse Mother   . Heart disease Mother   . Hyperlipidemia Mother   . Stroke Father   . Hypertension Father   . Cancer Father        thyroid  . Diabetes Father   . Early death Father   . Drug abuse Father   . Diabetes Sister   . Early death Sister   . Drug abuse Sister   . Hyperlipidemia Sister   . Diabetes Brother   . Depression Maternal Grandmother   . Alcohol abuse Maternal Grandfather   . Cancer Maternal Grandfather   . Cancer Paternal Grandfather     Allergies  Allergen Reactions  . Sulfa Antibiotics Rash    Current Outpatient Medications on File Prior to Visit  Medication Sig Dispense Refill  . aspirin 325 MG tablet Take 325 mg by mouth daily.    . busPIRone (BUSPAR) 15 MG tablet TAKE ONE TABLET BY MOUTH TWO TIMES DAILY for anxiety. 180 tablet 1  . Cholecalciferol (VITAMIN D3 PO) Take 1 each by mouth daily.    Marland Kitchen ibuprofen (ADVIL,MOTRIN) 200 MG tablet Take 600 mg by mouth every 6 (six) hours as needed for mild pain or moderate pain.    Marland Kitchen lamoTRIgine (LAMICTAL) 150 MG tablet Take 1 tablet by mouth daily.     . rosuvastatin (CRESTOR) 10 MG tablet Take 1 tablet (10 mg total) by mouth every evening. For cholesterol. 90 tablet 3   No current facility-administered medications on file prior to visit.    BP 130/82   Pulse (!) 117   Temp (!) 96.4 F (35.8 C) (Temporal)   Ht  5\' 5"  (1.651 m)   Wt 185 lb (83.9 kg)   SpO2 98%   BMI 30.79 kg/m    Objective:   Physical Exam  Constitutional: She appears well-nourished.  Cardiovascular: Normal rate and regular rhythm.  Respiratory: Effort normal and breath sounds normal.  Musculoskeletal:     Cervical back: Neck supple.  Skin: Skin is warm and dry.  Psychiatric:  She has a normal mood and affect.           Assessment & Plan:

## 2019-10-18 NOTE — Patient Instructions (Signed)
Stop taking Lexapro 10 mg for anxiety and depression.  Start duloxetine (Cymbalta) 20 mg once daily for depression. Continue Buspar.  You will be contacted regarding your referral to neurology.  Please let us know if you have not been contacted within two weeks.   Please schedule a follow up visit for 6 weeks as discussed.   It was a pleasure to see you today!

## 2019-10-18 NOTE — Assessment & Plan Note (Signed)
No improvement with Lexapro, anxiety improved with Buspar.   She has failed Zoloft and Prozac, could not tolerate Effexor due to nausea, discussed that there are little options remaining.   Rx for duloxetine sent to pharmacy. She will follow up in 6 weeks for evaluation. Continue Buspar.

## 2019-10-18 NOTE — Assessment & Plan Note (Signed)
Repeat lipids next visit. She doesn't have time to have this done today. Continue Crestor 10 mg for goal LDL <70.

## 2019-10-18 NOTE — Assessment & Plan Note (Signed)
Patient has not yet called her neurologist, new referral placed.

## 2019-10-18 NOTE — Assessment & Plan Note (Signed)
No improvement with Lexapro, anxiety improved with Buspar.   She has failed Zoloft and Prozac, could not tolerate Effexor due to nausea, discussed that there are little options remaining.   Rx for duloxetine sent to pharmacy. She will follow up in 6 weeks for evaluation. Continue Buspar.  

## 2019-11-29 ENCOUNTER — Ambulatory Visit: Payer: Medicare Other | Admitting: Primary Care

## 2020-04-02 DIAGNOSIS — H5711 Ocular pain, right eye: Secondary | ICD-10-CM | POA: Diagnosis not present

## 2021-04-03 ENCOUNTER — Other Ambulatory Visit: Payer: Self-pay | Admitting: Primary Care

## 2021-04-03 DIAGNOSIS — F411 Generalized anxiety disorder: Secondary | ICD-10-CM

## 2021-04-04 NOTE — Telephone Encounter (Signed)
Patient has not been seen since May 2021 and will need to be scheduled for further refills. I can only provide a 30 day supply.

## 2021-04-04 NOTE — Telephone Encounter (Signed)
Tried calling the patient and she did not answer. Unable to LVM for patient to call back due to it not being set up.

## 2021-04-16 NOTE — Telephone Encounter (Signed)
Please call patient and schedule appointment per PCP's note. Send back to PCP for refill after appointment is scheduled

## 2021-04-16 NOTE — Telephone Encounter (Signed)
Called patient and the vm has not been set up

## 2021-04-17 NOTE — Telephone Encounter (Signed)
Called patient and not able to lvm

## 2021-04-23 ENCOUNTER — Ambulatory Visit (INDEPENDENT_AMBULATORY_CARE_PROVIDER_SITE_OTHER): Payer: Medicare Other | Admitting: Primary Care

## 2021-04-23 ENCOUNTER — Other Ambulatory Visit: Payer: Self-pay

## 2021-04-23 VITALS — BP 114/76 | HR 105 | Temp 97.8°F | Resp 12 | Ht 65.0 in | Wt 173.0 lb

## 2021-04-23 DIAGNOSIS — Z1231 Encounter for screening mammogram for malignant neoplasm of breast: Secondary | ICD-10-CM

## 2021-04-23 DIAGNOSIS — F3341 Major depressive disorder, recurrent, in partial remission: Secondary | ICD-10-CM

## 2021-04-23 DIAGNOSIS — G40909 Epilepsy, unspecified, not intractable, without status epilepticus: Secondary | ICD-10-CM

## 2021-04-23 DIAGNOSIS — E785 Hyperlipidemia, unspecified: Secondary | ICD-10-CM

## 2021-04-23 DIAGNOSIS — E049 Nontoxic goiter, unspecified: Secondary | ICD-10-CM

## 2021-04-23 DIAGNOSIS — F411 Generalized anxiety disorder: Secondary | ICD-10-CM | POA: Diagnosis not present

## 2021-04-23 DIAGNOSIS — Z8673 Personal history of transient ischemic attack (TIA), and cerebral infarction without residual deficits: Secondary | ICD-10-CM | POA: Diagnosis not present

## 2021-04-23 MED ORDER — DULOXETINE HCL 20 MG PO CPEP: 20.0000 mg | ORAL_CAPSULE | Freq: Every day | ORAL | 3 refills | Status: AC

## 2021-04-23 MED ORDER — ROSUVASTATIN CALCIUM 10 MG PO TABS: 10.0000 mg | ORAL_TABLET | Freq: Every evening | ORAL | 3 refills | Status: DC

## 2021-04-23 MED ORDER — BUSPIRONE HCL 15 MG PO TABS: ORAL_TABLET | ORAL | 3 refills | Status: AC

## 2021-04-23 NOTE — Assessment & Plan Note (Signed)
No neurology visit since 2019. Office notes reviewed from Dr. Malvin Johns in 2019 who initiated Dilantin and Lamictal, however there are no notes regarding discontinuation. Discussed this today with patient.  She does not want to resume medications as she's been seizure free for 2-3 years. She is a poor historian. We discussed the potential repercussions regarding not taking medication and also for no routine neurology follow up. She verbalized understanding and continues to decline medication treatment.

## 2021-04-23 NOTE — Assessment & Plan Note (Signed)
Off Cymbalta 20 mg for >1 year, agree to resume.  Rx for Cymbalta 20 mg sent to pharmacy to resume.

## 2021-04-23 NOTE — Assessment & Plan Note (Signed)
Non compliant to follow up regarding repeat lipid panel and follow up for rosuvastatin 10 mg refills.  Repeat lipid panel pending today. Will also need to repeat in 2 months. Discussed this with patient today.

## 2021-04-23 NOTE — Assessment & Plan Note (Signed)
Checking TSH today.

## 2021-04-23 NOTE — Assessment & Plan Note (Signed)
Deteriorated since off Buspar 15 mg BID and Cymbalta 20 mg, refills provided. Discussed the importance for annual follow up.

## 2021-04-23 NOTE — Progress Notes (Signed)
Subjective:    Patient ID: Kim Gallagher, female    DOB: 06-18-1965, 55 y.o.   MRN: [id number]  HPI  Kim Gallagher is a very pleasant 55 y.o. female with a history of esizure disorder, tobacco abuse, GAD, MDD, CVA, hyperlipidemia who presents today for follow up of chronic conditions and medication refills. She is due for a mammogram.   1) Hyperlipidemia/History of CVA: Currently managed on rosuvastatin 10 mg daily. Last lipid panel on file was from April 2021 with LDL of 133. She ran out of rosuvastatin around April/May 2022.   She denies unilateral weakness, dizziness, speech changes, chest pain.   2) MDD/GAD: Currently managed on duloxetine 20 mg daily and buspirone 15 mg BID. She ran out of both medications in 2021 and has experienced anxiety, feeling nervous, irritability, feeling down. She would like to resume both medications as she endorses doing well on both.   3) Seizure Disorder: Previously managed on lamotrigine 150 mg daily, but has not taken for the last 1-2 years as she's not seen her neurologist since 2019.   She's not had a seizure in 2-3 years.   BP Readings from Last 3 Encounters:  04/23/21 114/76  10/18/19 130/82  09/29/19 106/66     Review of Systems  Eyes:  Negative for visual disturbance.  Respiratory:  Negative for shortness of breath.   Cardiovascular:  Negative for chest pain.  Neurological:  Negative for dizziness and headaches.  Psychiatric/Behavioral:  The patient is nervous/anxious.         Past Medical History:  Diagnosis Date   Anxiety    Breast lump or mass 04/29/2016   Chronic kidney disease    Chronic major depressive disorder    Dysuria    Emphysema lung (HCC)    Heavy smoker    History of TIA (transient ischemic attack)    Hyperlipidemia    Hyperlipidemia, acquired    Personality change due to stroke 10/05/2015   Porcelain gallbladder 05/07/2016   CT scan 2017   Seizures ([place])    r/t to stroke   Stroke ([place]) 11/16/2006   left  sided weakness   Urinary incontinence in female    UTI (urinary tract infection)    Vaginal mass 04/29/2016   Lateral right wall near cervix    Social History   Socioeconomic History   Marital status: Divorced    Spouse name: Not on file   Number of children: Not on file   Years of education: Not on file   Highest education level: Not on file  Occupational History   Not on file  Tobacco Use   Smoking status: Every Day    Packs/day: 1.00    Years: 40.00    Pack years: 40.00    Types: Cigarettes   Smokeless tobacco: Never  Vaping Use   Vaping Use: Never used  Substance and Sexual Activity   Alcohol use: Yes    Alcohol/week: 0.0 standard drinks    Comment: occasional   Drug use: Yes    Frequency: 1.0 times per week    Types: Marijuana   Sexual activity: Never    Birth control/protection: Post-menopausal  Other Topics Concern   Not on file  Social History Narrative   Not on file   Social Determinants of Health   Financial Resource Strain: Not on file  Food Insecurity: Not on file  Transportation Needs: Not on file  Physical Activity: Not on file  Stress: Not on file  Social  Connections: Not on file  Intimate Partner Violence: Not on file    Past Surgical History:  Procedure Laterality Date   CHOLECYSTECTOMY N/A 07/10/2016   Procedure: LAPAROSCOPIC CHOLECYSTECTOMY WITH INTRAOPERATIVE CHOLANGIOGRAM;  Surgeon: Kieth Brightly, MD;  Location: [facility] ORS;  Service: General;  Laterality: N/A;   COLONOSCOPY WITH PROPOFOL N/A 05/13/2016   Procedure: COLONOSCOPY WITH PROPOFOL;  Surgeon: Kieth Brightly, MD;  Location: [facility] END[facility]COPY;  Service: Endoscopy;  Laterality: N/A;   COLONOSCOPY WITH PROPOFOL N/A 05/21/2016   Procedure: COLONOSCOPY WITH PROPOFOL;  Surgeon: Kieth Brightly, MD;  Location: [facility] ENDOSCOPY;  Service: Endoscopy;  Laterality: N/A;   TUBAL LIGATION      Family History  Problem Relation Age of Onset   Kidney disease Mother     Hypertension Mother    Depression Mother    Diabetes Mother    Early death Mother    Drug abuse Mother    Heart disease Mother    Hyperlipidemia Mother    Stroke Father    Hypertension Father    Cancer Father        thyroid   Diabetes Father    Early death Father    Drug abuse Father    Diabetes Sister    Early death Sister    Drug abuse Sister    Hyperlipidemia Sister    Diabetes Brother    Depression Maternal Grandmother    Alcohol abuse Maternal Grandfather    Cancer Maternal Grandfather    Cancer Paternal Grandfather     Allergies  Allergen Reactions   Sulfa Antibiotics Rash    Current Outpatient Medications on File Prior to Visit  Medication Sig Dispense Refill   aspirin 325 MG tablet Take 325 mg by mouth daily.     busPIRone (BUSPAR) 15 MG tablet TAKE ONE TABLET BY MOUTH TWO TIMES DAILY for anxiety. 180 tablet 1   Cholecalciferol (VITAMIN D3 PO) Take 1 each by mouth daily.     DULoxetine (CYMBALTA) 20 MG capsule Take 1 capsule (20 mg total) by mouth daily. For anxiety and depression. 30 capsule 1   ibuprofen (ADVIL,MOTRIN) 200 MG tablet Take 600 mg by mouth every 6 (six) hours as needed for mild pain or moderate pain.     lamoTRIgine (LAMICTAL) 150 MG tablet Take 1 tablet by mouth daily.      rosuvastatin (CRESTOR) 10 MG tablet Take 1 tablet (10 mg total) by mouth every evening. For cholesterol. 90 tablet 3   No current facility-administered medications on file prior to visit.    BP 114/76   Pulse (!) 105   Temp 97.8 F (36.6 C)   Resp 12   Ht 5\' 5"  (1.651 m)   Wt 173 lb (78.5 kg)   SpO2 98%   BMI 28.79 kg/m  Objective:   Physical Exam Cardiovascular:     Rate and Rhythm: Normal rate and regular rhythm.  Pulmonary:     Effort: Pulmonary effort is normal.     Breath sounds: Normal breath sounds.  Musculoskeletal:     Cervical back: Neck supple.  Skin:    General: Skin is warm and dry.          Assessment & Plan:      This visit occurred  during the SARS-CoV-2 public health emergency.  Safety protocols were in place, including screening questions prior to the visit, additional usage of staff PPE, and extensive cleaning of exam room while observing appropriate contact time as indicated for disinfecting solutions.

## 2021-04-23 NOTE — Assessment & Plan Note (Signed)
No new symptoms. Exam today unremarkable for stroke.  LDL goal of <70, discussed this with patient. Continue aspirin 325 mg and will resume Crestor 10 mg.   Repeat lipids now and in 2 months.

## 2021-04-23 NOTE — Patient Instructions (Signed)
Stop by the lab prior to leaving today. I will notify you of your results once received.   Resume your medications as discussed.   For the Buspar, start with 1/2 tablet twice daily for one week, then increase to 1 tablet twice daily thereafter.  Come for your lab appointment in 2 months as discussed.  [facility]   It was a pleasure to see you today!

## 2021-04-24 LAB — COMPREHENSIVE METABOLIC PANEL
ALT: 14 U/L (ref 0–35)
AST: 16 U/L (ref 0–37)
Albumin: 4.6 g/dL (ref 3.5–5.2)
Alkaline Phosphatase: 112 U/L (ref 39–117)
BUN: 10 mg/dL (ref 6–23)
CO2: 28 mEq/L (ref 19–32)
Calcium: 9.7 mg/dL (ref 8.4–10.5)
Chloride: 102 mEq/L (ref 96–112)
Creatinine, Ser: 0.82 mg/dL (ref 0.40–1.20)
GFR: 79.49 mL/min (ref >60.00)
Glucose, Bld: 111 mg/dL — ABNORMAL HIGH (ref 70–99)
Potassium: 3.9 mEq/L (ref 3.5–5.1)
Sodium: 139 mEq/L (ref 135–145)
Total Bilirubin: 0.5 mg/dL (ref 0.2–1.2)
Total Protein: 7 g/dL (ref 6.0–8.3)

## 2021-04-24 LAB — CBC
HCT: 45.4 % (ref 36.0–46.0)
Hemoglobin: 15.1 g/dL — ABNORMAL HIGH (ref 12.0–15.0)
MCHC: 33.3 g/dL (ref 30.0–36.0)
MCV: 93.4 fl (ref 78.0–100.0)
Platelets: 221 10*3/uL (ref 150.0–400.0)
RBC: 4.86 Mil/uL (ref 3.87–5.11)
RDW: 13.8 % (ref 11.5–15.5)
WBC: 12.1 10*3/uL — ABNORMAL HIGH (ref 4.0–10.5)

## 2021-04-24 LAB — HEMOGLOBIN A1C: Hgb A1c MFr Bld: 5.4 % (ref 4.6–6.5)

## 2021-04-24 LAB — LIPID PANEL
Cholesterol: 256 mg/dL — ABNORMAL HIGH (ref 0–200)
HDL: 54.8 mg/dL (ref >39.00)
LDL Cholesterol: 177 mg/dL — ABNORMAL HIGH (ref 0–99)
NonHDL: 200.86
Total CHOL/HDL Ratio: 5
Triglycerides: 119 mg/dL (ref 0.0–149.0)
VLDL: 23.8 mg/dL (ref 0.0–40.0)

## 2021-04-24 LAB — TSH: TSH: 1.02 u[IU]/mL (ref 0.35–5.50)

## 2021-06-10 ENCOUNTER — Other Ambulatory Visit: Payer: Self-pay | Admitting: Primary Care

## 2021-06-10 DIAGNOSIS — E785 Hyperlipidemia, unspecified: Secondary | ICD-10-CM

## 2021-06-25 ENCOUNTER — Other Ambulatory Visit: Payer: Medicare Other

## 2021-10-30 ENCOUNTER — Telehealth: Payer: Self-pay | Admitting: Primary Care

## 2021-10-30 NOTE — Telephone Encounter (Signed)
Tried calling patient schedule Medicare Annual Wellness Visit (AWV) either virtually or phone  Not working phone #   awvi 01/31/14 per [facility]  please schedule at anytime with health coach  This should be a 45 minute visit.  I left my direct # [phone]

## 2023-01-21 ENCOUNTER — Telehealth: Payer: Self-pay | Admitting: Primary Care

## 2023-01-21 NOTE — Telephone Encounter (Signed)
Called number in patient file, number not in service.

## 2023-12-03 ENCOUNTER — Ambulatory Visit: Payer: Self-pay

## 2023-12-03 NOTE — Telephone Encounter (Signed)
 FYI Only or Action Required?: FYI only for provider.  Called Nurse Triage reporting Urinary Tract Infection. Symptoms began several weeks ago. Interventions attempted: Nothing. Symptoms are: gradually worsening.  Triage Disposition: See Physician Within 24 Hours  Patient/caregiver understands and will follow disposition?: Yes, will follow disposition  Copied from CRM [id number]. Topic: Clinical - Red Word Triage >> Dec 03, 2023  1:25 PM Suzen RAMAN wrote: Red Word that prompted transfer to Nurse Triage: extreme fatigue and foul smelling urine Reason for Disposition  Bad or foul-smelling urine  Answer Assessment - Initial Assessment Questions 1. SYMPTOM: What's the main symptom you're concerned about? (e.g., frequency, incontinence)     [place] smelling urine 2. ONSET: When did it  start?     2-3 weeks  3. PAIN: Is there any pain? If Yes, ask: How bad is it? (Scale: 1-10; mild, moderate, severe)     0 4. CAUSE: What do you think is causing the symptoms?     Kidney infection 5. OTHER SYMPTOMS: Do you have any other symptoms? (e.g., blood in urine, fever, flank pain, pain with urination)     Foul smelling urine  Protocols used: Urinary Symptoms-A-AH

## 2024-01-07 ENCOUNTER — Ambulatory Visit: Admitting: Primary Care

## 2024-02-02 ENCOUNTER — Ambulatory Visit: Payer: Self-pay | Admitting: General Practice

## 2024-03-14 ENCOUNTER — Ambulatory Visit (INDEPENDENT_AMBULATORY_CARE_PROVIDER_SITE_OTHER): Payer: Self-pay | Admitting: General Practice

## 2024-03-14 ENCOUNTER — Encounter: Payer: Self-pay | Admitting: General Practice

## 2024-03-14 ENCOUNTER — Ambulatory Visit: Payer: Self-pay | Admitting: General Practice

## 2024-03-14 VITALS — BP 122/78 | HR 78 | Temp 97.9°F | Ht 66.0 in | Wt 157.6 lb

## 2024-03-14 DIAGNOSIS — F32A Depression, unspecified: Secondary | ICD-10-CM | POA: Insufficient documentation

## 2024-03-14 DIAGNOSIS — N3001 Acute cystitis with hematuria: Secondary | ICD-10-CM | POA: Insufficient documentation

## 2024-03-14 DIAGNOSIS — E785 Hyperlipidemia, unspecified: Secondary | ICD-10-CM | POA: Insufficient documentation

## 2024-03-14 DIAGNOSIS — R8290 Unspecified abnormal findings in urine: Secondary | ICD-10-CM | POA: Insufficient documentation

## 2024-03-14 DIAGNOSIS — Z7689 Persons encountering health services in other specified circumstances: Secondary | ICD-10-CM | POA: Insufficient documentation

## 2024-03-14 DIAGNOSIS — Z716 Tobacco abuse counseling: Secondary | ICD-10-CM | POA: Insufficient documentation

## 2024-03-14 DIAGNOSIS — F419 Anxiety disorder, unspecified: Secondary | ICD-10-CM

## 2024-03-14 LAB — CBC WITH DIFFERENTIAL/PLATELET
Basophils Absolute: 0.1 K/uL (ref 0.0–0.1)
Basophils Relative: 1.2 % (ref 0.0–3.0)
Eosinophils Absolute: 0.2 K/uL (ref 0.0–0.7)
Eosinophils Relative: 3.4 % (ref 0.0–5.0)
HCT: 45.9 % (ref 36.0–46.0)
Hemoglobin: 14.9 g/dL (ref 12.0–15.0)
Lymphocytes Relative: 25.5 % (ref 12.0–46.0)
Lymphs Abs: 1.7 K/uL (ref 0.7–4.0)
MCHC: 32.5 g/dL (ref 30.0–36.0)
MCV: 92.1 fl (ref 78.0–100.0)
Monocytes Absolute: 0.4 K/uL (ref 0.1–1.0)
Monocytes Relative: 5.9 % (ref 3.0–12.0)
Neutro Abs: 4.3 K/uL (ref 1.4–7.7)
Neutrophils Relative %: 64 % (ref 43.0–77.0)
Platelets: 173 K/uL (ref 150.0–400.0)
RBC: 4.98 Mil/uL (ref 3.87–5.11)
RDW: 14.2 % (ref 11.5–15.5)
WBC: 6.7 K/uL (ref 4.0–10.5)

## 2024-03-14 LAB — POC URINALSYSI DIPSTICK (AUTOMATED)
Bilirubin, UA: NEGATIVE
Blood, UA: 200
Glucose, UA: NEGATIVE
Ketones, UA: NEGATIVE
Nitrite, UA: POSITIVE
Protein, UA: POSITIVE — AB
Spec Grav, UA: 1.025 (ref 1.010–1.025)
Urobilinogen, UA: 0.2 U/dL
pH, UA: 5.5 (ref 5.0–8.0)

## 2024-03-14 LAB — COMPREHENSIVE METABOLIC PANEL WITH GFR
ALT: 9 U/L (ref 0–35)
AST: 15 U/L (ref 0–37)
Albumin: 4.3 g/dL (ref 3.5–5.2)
Alkaline Phosphatase: 102 U/L (ref 39–117)
BUN: 12 mg/dL (ref 6–23)
CO2: 29 meq/L (ref 19–32)
Calcium: 8.9 mg/dL (ref 8.4–10.5)
Chloride: 104 meq/L (ref 96–112)
Creatinine, Ser: 0.85 mg/dL (ref 0.40–1.20)
GFR: 74.6 mL/min (ref >60.00)
Glucose, Bld: 93 mg/dL (ref 70–99)
Potassium: 4.1 meq/L (ref 3.5–5.1)
Sodium: 138 meq/L (ref 135–145)
Total Bilirubin: 0.5 mg/dL (ref 0.2–1.2)
Total Protein: 6.3 g/dL (ref 6.0–8.3)

## 2024-03-14 LAB — LIPID PANEL
Cholesterol: 240 mg/dL — ABNORMAL HIGH (ref 0–200)
HDL: 46.4 mg/dL (ref >39.00)
LDL Cholesterol: 158 mg/dL — ABNORMAL HIGH (ref 0–99)
NonHDL: 193.55
Total CHOL/HDL Ratio: 5
Triglycerides: 176 mg/dL — ABNORMAL HIGH (ref 0.0–149.0)
VLDL: 35.2 mg/dL (ref 0.0–40.0)

## 2024-03-14 LAB — TSH: TSH: 1.09 u[IU]/mL (ref 0.35–5.50)

## 2024-03-14 MED ORDER — SERTRALINE HCL 50 MG PO TABS: 50.0000 mg | ORAL_TABLET | Freq: Every day | ORAL | 0 refills | Status: AC

## 2024-03-14 MED ORDER — NITROFURANTOIN MONOHYD MACRO 100 MG PO CAPS: 100.0000 mg | ORAL_CAPSULE | Freq: Two times a day (BID) | ORAL | 0 refills | Status: DC

## 2024-03-14 NOTE — Progress Notes (Signed)
 New Patient Office Visit  Subjective    Patient ID: Kim Gallagher, female    DOB: [birth year]  Age: 58 y.o. MRN: [id number]  CC:  Chief Complaint  Patient presents with   New Patient (Initial Visit)   odor    Patient has been having urinary odor x couple months; not other sx.    Depression    Wants to discuss going back on zoloft ; was on in the past.     HPI Kim Gallagher is a 58 y.o. female presents to establish care. Previous PCP/physical/labs: Dr. Susa in [place]  Discussed the use of AI scribe software for clinical note transcription with the patient, who gave verbal consent to proceed.  History of Present Illness Kim Gallagher is a 58 year old female who presents with urinary odor and depression.  She has been experiencing a urinary odor for the past couple of months. She has urgency and frequency but no dysuria.No vaginal discharge, flank pain, blood in the urine, fever, chills, chest pain, shortness of breath, nausea, vomiting, diarrhea, constipation, dizziness, or headaches.   She has a history of depression and was previously on Zoloft  at a dose of 100 mg taken at bedtime. Current symptoms include low energy, lack of motivation, frequent crying, and sadness. No thoughts of self-harm or harm to others. She wants to resume Zoloft  treatment.  She smokes a pack of cigarettes a day and does not want to [place]. Her family history includes high blood pressure and diabetes in both parents. She has been diagnosed with high cholesterol in the past but denies any history of high blood pressure herself.   Outpatient Encounter Medications as of 03/14/2024  Medication Sig   nitrofurantoin, macrocrystal-monohydrate, (MACROBID) 100 MG capsule Take 1 capsule (100 mg total) by mouth 2 (two) times daily for 5 days.   pravastatin  (PRAVACHOL ) 40 MG tablet Take 40 mg by mouth daily.   sertraline  (ZOLOFT ) 50 MG tablet Take 1 tablet (50 mg total) by mouth daily.   No facility-administered encounter  medications on file as of 03/14/2024.    Past Medical History:  Diagnosis Date   Depression     Past Surgical History:  Procedure Laterality Date   GALLBLADDER SURGERY  2010    Family History  Problem Relation Age of Onset   Drug abuse Mother    Diabetes Mother    Drug abuse Father    Diabetes Father    Early death Sister    Drug abuse Sister     Social History   Socioeconomic History   Marital status: Single    Spouse name: Not on file   Number of children: Not on file   Years of education: Not on file   Highest education level: Not on file  Occupational History   Not on file  Tobacco Use   Smoking status: Every Day    Types: Cigarettes   Smokeless tobacco: Never  Vaping Use   Vaping status: Unknown  Substance and Sexual Activity   Alcohol use: Yes   Drug use: Not on file   Sexual activity: Not Currently  Other Topics Concern   Not on file  Social History Narrative   Not on file   Social Drivers of Health   Financial Resource Strain: Not on file  Food Insecurity: Not on file  Transportation Needs: Not on file  Physical Activity: Not on file  Stress: Not on file  Social Connections: Not on file  Intimate Partner Violence: Not on file  Review of Systems  Constitutional:  Negative for chills and fever.  Respiratory:  Negative for shortness of breath.   Cardiovascular:  Negative for chest pain.  Gastrointestinal:  Negative for abdominal pain, constipation, diarrhea, heartburn, nausea and vomiting.  Genitourinary:  Positive for frequency and urgency. Negative for dysuria and flank pain.       Urinary odor  Neurological:  Negative for dizziness and headaches.  Endo/Heme/Allergies:  Negative for polydipsia.  Psychiatric/Behavioral:  Positive for depression. Negative for suicidal ideas. The patient is nervous/anxious and has insomnia.         Objective    BP 122/78   Pulse 78   Temp 97.9 F (36.6 C) (Oral)   Ht 5' 6 (1.676 m)   Wt 157 lb  9.6 oz (71.5 kg)   SpO2 98%   BMI 25.44 kg/m   Physical Exam Vitals and nursing note reviewed.  Constitutional:      Appearance: Normal appearance.  Cardiovascular:     Rate and Rhythm: Normal rate and regular rhythm.     Pulses: Normal pulses.     Heart sounds: Normal heart sounds.  Pulmonary:     Effort: Pulmonary effort is normal.     Breath sounds: Normal breath sounds.  Neurological:     Mental Status: She is alert and oriented to person, place, and time.  Psychiatric:        Mood and Affect: Mood normal.        Behavior: Behavior normal.        Thought Content: Thought content normal.        Judgment: Judgment normal.         Assessment & Plan:  Acute cystitis with hematuria -     Nitrofurantoin Monohyd Macro; Take 1 capsule (100 mg total) by mouth 2 (two) times daily for 5 days.  Dispense: 10 capsule; Refill: 0 -     CBC with Differential/Platelet -     Comprehensive metabolic panel with GFR  Establishing care with new doctor, encounter for Assessment & Plan: EMR reviewed briefly.    Abnormal urine odor -     POCT Urinalysis Dipstick (Automated) -     Urine Culture  Anxiety and depression -     Sertraline  HCl; Take 1 tablet (50 mg total) by mouth daily.  Dispense: 30 tablet; Refill: 0 -     Ambulatory referral to Psychology  Hyperlipidemia, unspecified hyperlipidemia type -     Lipid panel -     TSH  Tobacco abuse counseling Assessment & Plan: Smoking cessation instruction/counseling given:  counseled patient on the dangers of tobacco use, advised patient to stop smoking, and reviewed strategies to maximize success      Assessment and Plan Assessment & Plan Urinary tract infection Urinary tract infection with nitrites and blood in urine, no systemic symptoms.  - Prescribed Macrobid 100 mg BID for 5 days. - Ordered urine culture for antibiotic sensitivity. - Advised to complete antibiotics course. - labs pending.  Major depressive disorder,  single episode Recurrent depressive symptoms, no suicidal ideation. Previous effective treatment with sertraline . Agreed to restart medication and therapy. - Prescribed sertraline  25 mg, half tablet daily at bedtime for 1 week, then 50 mg daily. - Referred to therapy. - Scheduled follow-up in 4 weeks.  Hyperlipidemia Hyperlipidemia, no recent dietary changes. - Lipid panel pending.  Tobacco use Chronic tobacco use, one pack per day. Not interested in quitting or screenings.  General Health Maintenance Declines vaccinations, open to mammograms  and Pap smears. Previous colonoscopy completed. - Discuss mammograms and Pap smears at next visit.   Return in about 4 weeks (around 04/11/2024) for anxiety and depression.   Carrol Aurora, NP

## 2024-03-14 NOTE — Assessment & Plan Note (Signed)
 EMR reviewed briefly.

## 2024-03-14 NOTE — Patient Instructions (Addendum)
 Stop by the lab prior to leaving today. I will notify you of your results once received.   Start Macrobid (nitrofurantoin) tablets for urinary tract infection. Take 1 tablet by mouth twice daily for 5 days.  Start Sertraline  25 mg once daily at bedtime and then increase to 50 mg once thereafter.   You will either be contacted via phone regarding your referral to therapist , or you may receive a letter on your MyChart portal from our referral team with instructions for scheduling an appointment. Please let us  know if you have not been contacted by anyone within two weeks.  Follow up in 4 weeks.   It was a pleasure to meet you today! Please don't hesitate to contact me with any questions. Welcome to [facility]!

## 2024-03-14 NOTE — Assessment & Plan Note (Signed)
 Smoking cessation instruction/counseling given:  counseled patient on the dangers of tobacco use, advised patient to stop smoking, and reviewed strategies to maximize success

## 2024-03-15 ENCOUNTER — Encounter: Payer: Self-pay | Admitting: Primary Care

## 2024-03-17 ENCOUNTER — Other Ambulatory Visit: Payer: Self-pay | Admitting: Primary Care

## 2024-03-17 ENCOUNTER — Ambulatory Visit: Payer: Self-pay

## 2024-03-17 DIAGNOSIS — N3001 Acute cystitis with hematuria: Secondary | ICD-10-CM

## 2024-03-17 MED ORDER — CEPHALEXIN 500 MG PO CAPS: 500.0000 mg | ORAL_CAPSULE | Freq: Two times a day (BID) | ORAL | 0 refills | Status: AC

## 2024-03-17 NOTE — Telephone Encounter (Signed)
 Tell patient to stop the Macrobid.   I will send a new antibiotic to her pharmacy called cephalexin.  She is to take 1 capsule by mouth twice daily for 7 days.

## 2024-03-17 NOTE — Telephone Encounter (Signed)
 FYI Only or Action Required?: Action required by provider: update on patient condition.  Patient was last seen in primary care on 03/14/2024 by Vincente Shivers, NP.  Called Nurse Triage reporting Medication Reaction.  Symptoms began several days ago.  Interventions attempted: Nothing.  Symptoms are: unchanged.  Triage Disposition: Call PCP Now  States she started vomiting after starting Macrobid. Asking for different antibiotic. Please advise pt.  Patient/caregiver understands and will follow disposition?: Yes     Copied from CRM [id number]. Topic: Clinical - Red Word Triage >> Mar 17, 2024 10:47 AM Suzen RAMAN wrote: Red Word that prompted transfer to Nurse Triage: patient vomiting for 2 days from nitrofurantoin, macrocrystal-monohydrate, (MACROBID) 100 MG capsule. Inquiry if there is an alternate medication that can be prescribed. Reason for Disposition  [1] Caller has URGENT medicine question about med that primary care doctor (or NP/PA) or specialist prescribed AND [2] triager unable to answer question  Answer Assessment - Initial Assessment Questions 1. NAME of MEDICINE: What medicine(s) are you calling about?     Macrobid 2. QUESTION: What is your question? (e.g., double dose of medicine, side effect)     Vomiting, side effect 3. PRESCRIBER: Who prescribed the medicine? Reason: if prescribed by specialist, call should be referred to that group.     PCP 4. SYMPTOMS: Do you have any symptoms? If Yes, ask: What symptoms are you having?  How bad are the symptoms (e.g., mild, moderate, severe)     VOMITING 5. PREGNANCY:  Is there any chance that you are pregnant? When was your last menstrual period?     NO  Protocols used: Medication Question Call-A-AH

## 2024-03-17 NOTE — Telephone Encounter (Signed)
 Spoke with patient and advised of medication changes. Patient verbalized understanding.

## 2024-03-17 NOTE — Telephone Encounter (Signed)
 Patient was treated by [facility]. She is out of the office. Will she need to be seen in office?

## 2024-03-18 MED ORDER — PRAVASTATIN SODIUM 40 MG PO TABS: 40.0000 mg | ORAL_TABLET | Freq: Every day | ORAL | 1 refills | Status: AC

## 2024-03-19 LAB — URINE CULTURE
MICRO NUMBER:: 17091000
SPECIMEN QUALITY:: ADEQUATE

## 2024-04-08 ENCOUNTER — Other Ambulatory Visit: Payer: Self-pay | Admitting: General Practice

## 2024-04-08 DIAGNOSIS — F32A Depression, unspecified: Secondary | ICD-10-CM

## 2024-04-15 ENCOUNTER — Ambulatory Visit: Admitting: General Practice

## 2024-04-15 DIAGNOSIS — E785 Hyperlipidemia, unspecified: Secondary | ICD-10-CM

## 2024-04-22 ENCOUNTER — Ambulatory Visit: Admitting: Nurse Practitioner

## 2024-04-26 ENCOUNTER — Ambulatory Visit: Admitting: Family Medicine
